# Patient Record
Sex: Female | Born: 1985 | Race: White | Hispanic: No | Marital: Single | Smoking: Never smoker
Health system: Southern US, Community
[De-identification: ages and names within clinical notes are randomized; demographics above are authoritative.]

## PROBLEM LIST (undated history)

## (undated) ENCOUNTER — Inpatient Hospital Stay (HOSPITAL_COMMUNITY): Payer: Self-pay

## (undated) DIAGNOSIS — O368390 Maternal care for abnormalities of the fetal heart rate or rhythm, unspecified trimester, not applicable or unspecified: Secondary | ICD-10-CM

## (undated) DIAGNOSIS — F909 Attention-deficit hyperactivity disorder, unspecified type: Secondary | ICD-10-CM

## (undated) DIAGNOSIS — F32A Depression, unspecified: Secondary | ICD-10-CM

## (undated) DIAGNOSIS — J45909 Unspecified asthma, uncomplicated: Secondary | ICD-10-CM

## (undated) DIAGNOSIS — F329 Major depressive disorder, single episode, unspecified: Secondary | ICD-10-CM

## (undated) HISTORY — DX: Unspecified asthma, uncomplicated: J45.909

## (undated) HISTORY — DX: Major depressive disorder, single episode, unspecified: F32.9

## (undated) HISTORY — PX: NO PAST SURGERIES: SHX2092

## (undated) HISTORY — DX: Attention-deficit hyperactivity disorder, unspecified type: F90.9

## (undated) HISTORY — DX: Depression, unspecified: F32.A

---

## 2007-06-11 ENCOUNTER — Ambulatory Visit: Payer: Self-pay | Admitting: Family Medicine

## 2007-07-05 ENCOUNTER — Ambulatory Visit: Payer: Self-pay

## 2010-01-07 ENCOUNTER — Ambulatory Visit: Payer: Self-pay | Admitting: Family Medicine

## 2011-08-25 ENCOUNTER — Ambulatory Visit (INDEPENDENT_AMBULATORY_CARE_PROVIDER_SITE_OTHER): Payer: PRIVATE HEALTH INSURANCE | Admitting: Family Medicine

## 2011-08-25 VITALS — BP 106/76 | HR 89 | Temp 98.7°F | Resp 16 | Ht 67.5 in | Wt 122.0 lb

## 2011-08-25 DIAGNOSIS — N912 Amenorrhea, unspecified: Secondary | ICD-10-CM

## 2011-08-25 NOTE — Progress Notes (Signed)
[facility] [address], [place] [address] [phone]  Date:  08/25/2011   Name:  Erin Ayala   DOB:  1985/10/26   MRN:  [id number]  PCP:  No primary provider on file.    Chief Complaint: Possible Pregnancy   History of Present Illness:  Erin Ayala is a 26 y.o. very pleasant female patient who presents with the following:  She took a home HCG test and it was positive yesterday.  Her menses can be irregular- tends to have long cycles.  She noted nausea - she vomited yesterday at roller derby practice-  and sore breasts.  Her LMP was 07/10/11.  She has not had any bleeding or pain.    She has never been pregnant before.  Her pregnancy is unexpected but still good news.  She is here with her husband today- they plan to have the baby and are surprised but pleased.    Luccia is otherwise generally healthy.  She does not have an Chief Financial Officer in town yet.  She uses her adderall sporadically- she plans to stop this now that she is pregnant.    There is no problem list on file for this patient.   No past medical history on file.  No past surgical history on file.  History  Substance Use Topics  . Smoking status: Never Smoker   . Smokeless tobacco: Not on file  . Alcohol Use: Not on file    No family history on file.  No Known Allergies  Medication list has been reviewed and updated.  Current Outpatient Prescriptions on File Prior to Visit  Medication Sig Dispense Refill  . amphetamine-dextroamphetamine (ADDERALL) 10 MG tablet Take 10 mg by mouth 4 (four) times daily.        Review of Systems:  As per HPI- otherwise negative.   Physical Examination: Filed Vitals:   08/25/11 1341  BP: 106/76  Pulse: 89  Temp: 98.7 F (37.1 C)  Resp: 16   Filed Vitals:   08/25/11 1341  Height: 5' 7.5" (1.715 m)  Weight: 122 lb (55.339 kg)   Body mass index is 18.83 kg/(m^2). Ideal Body Weight: Weight in (lb) to have BMI = 25: 161.7   GEN: WDWN,  NAD, Non-toxic, A & O x 3 HEENT: Atraumatic, Normocephalic. Neck supple. No masses, No LAD.  Oropharynx wnl.  Ears and Nose: No external deformity. CV: RRR, No M/G/R. No JVD. No thrill. No extra heart sounds. PULM: CTA B, no wheezes, crackles, rhonchi. No retractions. No resp. distress. No accessory muscle use. ABD: S, NT, ND, +BS. No rebound. No HSM. EXTR: No c/c/e NEURO Normal gait.  PSYCH: Normally interactive. Conversant. Not depressed or anxious appearing.  Calm demeanor.   Assessment and Plan: 1. Amenorrhea  hCG, serum, qualitative   Terisha could not give a urine sample today- she elected to have a blood draw for HCG testing.  I will contact her with the results- however she knows that a positive urine test is usually accurate.   Went over basic guidelines- she will stop roller derby, and stop her adderall. See pt instructions for more.  She will call the OB of her choice and set up an appt  Abbe Amsterdam, MD

## 2011-08-25 NOTE — Patient Instructions (Addendum)
Congratulations!   I will be in touch with your blood test result.   By your last period, you are about 6.[redacted] weeks pregnant today Your estimated due date is 04/15/12.    Avoid tobacco, alcohol, and fish high in mercury. Start your pre-natal vitamin today   Buy a book such as "What to expect" for lots of good information   [facility] [address] [phone]  [facility] [address] [place], [place] [address] [phone]   [facility] [address] [place], [place] [phone]

## 2011-10-08 ENCOUNTER — Other Ambulatory Visit: Payer: PRIVATE HEALTH INSURANCE

## 2011-10-08 ENCOUNTER — Ambulatory Visit (INDEPENDENT_AMBULATORY_CARE_PROVIDER_SITE_OTHER): Payer: Medicaid Other

## 2011-10-08 DIAGNOSIS — Z331 Pregnant state, incidental: Secondary | ICD-10-CM

## 2011-10-08 LAB — POCT URINALYSIS DIPSTICK
Bilirubin, UA: NEGATIVE
Glucose, UA: NEGATIVE
pH, UA: 5

## 2011-10-09 LAB — PRENATAL PANEL VII
Basophils Relative: 0 % (ref 0–1)
HCT: 34.8 % — ABNORMAL LOW (ref 36.0–46.0)
HIV: NONREACTIVE
Hemoglobin: 11.7 g/dL — ABNORMAL LOW (ref 12.0–15.0)
Hepatitis B Surface Ag: NEGATIVE
Lymphs Abs: 1.6 10*3/uL (ref 0.7–4.0)
MCH: 29 pg (ref 26.0–34.0)
MCHC: 33.6 g/dL (ref 30.0–36.0)
Monocytes Absolute: 0.6 10*3/uL (ref 0.1–1.0)
Monocytes Relative: 7 % (ref 3–12)
Neutro Abs: 5.7 10*3/uL (ref 1.7–7.7)
RBC: 4.04 MIL/uL (ref 3.87–5.11)
Rh Type: POSITIVE
Rubella: 31.6 IU/mL — ABNORMAL HIGH

## 2011-10-11 ENCOUNTER — Other Ambulatory Visit: Payer: Self-pay

## 2011-10-11 LAB — CULTURE, OB URINE: Colony Count: >=100000

## 2011-10-11 MED ORDER — NITROFURANTOIN MONOHYD MACRO 100 MG PO CAPS: 100.0000 mg | ORAL_CAPSULE | Freq: Two times a day (BID) | ORAL | Status: AC

## 2011-10-12 ENCOUNTER — Ambulatory Visit (INDEPENDENT_AMBULATORY_CARE_PROVIDER_SITE_OTHER): Payer: Medicaid Other

## 2011-10-12 ENCOUNTER — Encounter: Payer: Self-pay | Admitting: Obstetrics and Gynecology

## 2011-10-12 ENCOUNTER — Ambulatory Visit (INDEPENDENT_AMBULATORY_CARE_PROVIDER_SITE_OTHER): Payer: Medicaid Other | Admitting: Obstetrics and Gynecology

## 2011-10-12 VITALS — BP 98/60 | Wt 130.0 lb

## 2011-10-12 DIAGNOSIS — J45909 Unspecified asthma, uncomplicated: Secondary | ICD-10-CM

## 2011-10-12 DIAGNOSIS — F329 Major depressive disorder, single episode, unspecified: Secondary | ICD-10-CM | POA: Insufficient documentation

## 2011-10-12 DIAGNOSIS — Z331 Pregnant state, incidental: Secondary | ICD-10-CM

## 2011-10-12 DIAGNOSIS — F32A Depression, unspecified: Secondary | ICD-10-CM | POA: Insufficient documentation

## 2011-10-12 DIAGNOSIS — N926 Irregular menstruation, unspecified: Secondary | ICD-10-CM | POA: Insufficient documentation

## 2011-10-12 DIAGNOSIS — F988 Other specified behavioral and emotional disorders with onset usually occurring in childhood and adolescence: Secondary | ICD-10-CM

## 2011-10-12 LAB — US OB COMP LESS 14 WKS

## 2011-10-12 NOTE — Progress Notes (Signed)
Subjective:    Erin Ayala is being seen today for her first obstetrical visit at [redacted]w[redacted]d gestation by uncertain LMP--cycles are at least 40-45 days long, so patient feels she is not as far as 10 weeks.    She reports no issues or concerns.  Her obstetrical history is significant for: Patient Active Problem List  Diagnosis  . Asthma  . ADD (attention deficit disorder)  . Depression  . Irregular periods/menstrual cycles    Relationship with FOB:  Partner, lives with patient  Patient does intend to breast feed.   Pregnancy history fully reviewed.  The following portions of the patient's history were reviewed and updated as appropriate: allergies, current medications, past family history, past medical history, past social history, past surgical history and problem list.  Review of Systems Pertinent ROS is described in HPI   Objective:   BP 98/60  Wt 130 lb (58.968 kg)  LMP 07/10/2011 Wt Readings from Last 1 Encounters:  10/12/11 130 lb (58.968 kg)   BMI: There is no height on file to calculate BMI.  General: alert, cooperative and no distress Respiratory: clear to auscultation bilaterally Cardiovascular: regular rate and rhythm, S1, S2 normal, no murmur Breasts:  No dominant masses, nipples erect Gastrointestinal: soft, non-tender; no masses,  no organomegaly Extremities: extremities normal, no pain or edema Vaginal Bleeding: None  EXTERNAL GENITALIA: normal appearing vulva with no masses, tenderness or lesions VAGINA: no abnormal discharge or lesions CERVIX: no lesions or cervical motion tenderness; cervix closed, long, firm UTERUS: gravid and consistent with 13-14 weeks ADNEXA: no masses palpable and nontender OB EXAM PELVIMETRY: appears adequate  [place] today:  13 weeks, EDC 04/18/12--with establish that as EDC due to long cycles.  FHR:  160  bpm  Assessment:    Pregnancy at  13 weeks--dating by [place] c/w LMP Plans Quad screen Plan:     Prenatal panel reviewed  and discussed with the patient:yes Pap smear collected:yes GC/Chlamydia collected:yes Wet prep:  Neg Discussion of Genetic testing options: Desires Quad screen Prenatal vitamins recommended Problem list reviewed and updated.  Plan of care: Follow up in 4-5 weeks for anatomy US and ROB Quad screen at that visit.  Nigel Bridgeman CNM, [place] 10/12/2011 11:20 PM

## 2011-10-12 NOTE — Progress Notes (Signed)
[redacted]w[redacted]d Pt unable to void

## 2011-10-14 ENCOUNTER — Encounter: Payer: Self-pay | Admitting: Obstetrics and Gynecology

## 2011-10-14 LAB — PAP IG, CT-NG, RFX HPV ASCU
Chlamydia Probe Amp: NEGATIVE
GC Probe Amp: NEGATIVE

## 2011-11-11 ENCOUNTER — Encounter: Payer: Self-pay | Admitting: Obstetrics and Gynecology

## 2011-11-11 ENCOUNTER — Ambulatory Visit (INDEPENDENT_AMBULATORY_CARE_PROVIDER_SITE_OTHER): Payer: Medicaid Other | Admitting: Obstetrics and Gynecology

## 2011-11-11 ENCOUNTER — Other Ambulatory Visit: Payer: Self-pay

## 2011-11-11 ENCOUNTER — Ambulatory Visit (INDEPENDENT_AMBULATORY_CARE_PROVIDER_SITE_OTHER): Payer: Medicaid Other

## 2011-11-11 VITALS — BP 90/58 | Wt 136.0 lb

## 2011-11-11 DIAGNOSIS — Z3689 Encounter for other specified antenatal screening: Secondary | ICD-10-CM

## 2011-11-11 DIAGNOSIS — Z331 Pregnant state, incidental: Secondary | ICD-10-CM

## 2011-11-11 LAB — US OB COMP + 14 WK

## 2011-11-11 NOTE — Progress Notes (Signed)
Patient ID: Erin Ayala, female   DOB: 08-Sep-1985, 26 y.o.   MRN: [id number] 110w2d QUAD today pt request. [place] discussed f/o limited anatomy. Lavera Guise, CNM

## 2011-11-11 NOTE — Progress Notes (Signed)
[redacted]w[redacted]d Posterior placenta Marginal placental cord insertion Cord is less than 1 cm from uterine wall WNL fluid AP pocket 5.2 cm F/u 2 wks for anatomy completion. Follow marginal cord growth study at 26 wks cx closed

## 2011-11-12 LAB — AFP, QUAD SCREEN
Age Alone: 1:1060 {titer}
Down Syndrome Scr Risk Est: 1:5610 {titer}
HCG, Total: 15113 m[IU]/mL
INH: 167.3 pg/mL
MoM for hCG: 0.85
Open Spina bifida: NEGATIVE
Osb Risk: <1:27300 {titer}
Tri 18 Scr Risk Est: NEGATIVE
Trisomy 18 (Edward) Syndrome Interp.: 1:35400 {titer}

## 2011-11-29 ENCOUNTER — Ambulatory Visit (INDEPENDENT_AMBULATORY_CARE_PROVIDER_SITE_OTHER): Payer: Medicaid Other

## 2011-11-29 ENCOUNTER — Ambulatory Visit (INDEPENDENT_AMBULATORY_CARE_PROVIDER_SITE_OTHER): Payer: Medicaid Other | Admitting: Obstetrics and Gynecology

## 2011-11-29 VITALS — BP 100/56 | Wt 144.0 lb

## 2011-11-29 DIAGNOSIS — Z331 Pregnant state, incidental: Secondary | ICD-10-CM

## 2011-11-29 DIAGNOSIS — Z3689 Encounter for other specified antenatal screening: Secondary | ICD-10-CM

## 2011-11-29 LAB — US OB FOLLOW UP

## 2011-11-29 NOTE — Progress Notes (Signed)
Pt stated no issues today.  

## 2011-11-29 NOTE — Progress Notes (Signed)
Patient ID: Erin Ayala, female   DOB: Nov 29, 1985, 26 y.o.   MRN: [id number] [redacted]w[redacted]d [place] today reviewed plan f/o 28 weeks growth with marginal insertion. Lavera Guise, CNM

## 2011-11-29 NOTE — Progress Notes (Signed)
F/o [place] with placenta posterior marginal cord insertion and circumvallate  F/o [place] growth 28 weeks

## 2011-12-27 ENCOUNTER — Encounter: Payer: PRIVATE HEALTH INSURANCE | Admitting: Obstetrics and Gynecology

## 2012-01-03 ENCOUNTER — Encounter: Payer: PRIVATE HEALTH INSURANCE | Admitting: Obstetrics and Gynecology

## 2012-01-18 ENCOUNTER — Encounter: Payer: PRIVATE HEALTH INSURANCE | Admitting: Obstetrics and Gynecology

## 2012-01-25 ENCOUNTER — Encounter: Payer: Self-pay | Admitting: Obstetrics and Gynecology

## 2012-01-26 NOTE — L&D Delivery Note (Signed)
Erin Ayala, Brines DOB: 1985-04-30  Vacuum Extraction Vaginal Delivery Note  DATE: April 22, 2012  PreOp Diagnosis: [redacted]w[redacted]d Weeks Gestation    Second stage fetal heart rate decelerations  PostOp Diagnosis: [redacted]w[redacted]d Weeks Gestation    Second stage fetal heart rate decelerations                                     Body cord    Second Degree Midline Laceration  Procedure:  Vacuum Extraction Vaginal Delivery    Repair of Second Degree Laceration  Obstetrician:  Leonard Schwartz, M.D.  Anesthesia:  Epidural  Disposition:  The patient has been followed at the [address] division of [facility].  The second stage of labor was complicated by  variable decelerations to 60 beats per minute with each contraction. A cesarean section was recommended but declined. The patient elected to to continue to push. She accepted the potential risks of fetal harm. Once the fetal head was at a +3 station, we discussed our management options which include observation only, continued pushing, operative vaginal delivery, and Cesarean delivery.  The risk and benefits of each of these options were reviewed with the patient and her family.  Questions were answered. The patient elected to proceed with operative vaginal delivery. We reviewed forceps vaginal delivery and vacuum extraction vaginal delivery.  The risks of each were outlined. The patient elected vacuum extraction vaginal delivery.  The specific risks were discussed including caput formation, hematoma formation, the uncommon risk of intracranial bleeding and fetal morbidity, and the risk that the procedure may not be successful and an emergency  Cesarean section may be required.  The risks of Cesarean Section were outlined including anesthetic complications, bleeding, infection, and possible damage to the surrounding organs. The patient accepted the above risks and requested that we proceed.  Findings:  A  female ( [name]) was delivered  from a occiput anterior position.  The Apgar scores were 8 at one minute and 9 at five minutes. The placenta had a three vessel cord.  The placental surfaces appeared normal.  The placenta was sent to pathology.  A second degree laceration was noted.  No upper vaginal or cervical lacerations were seen.  Procedure:  The patient was evaluated on the Labor and Delivery suite.  The procedure was reviewed and a permit was signed.  The patient was placed in a lithotomy position.  The perineum was prepped and draped.  The bladder was emptied previously by a Foley catheter.  The cervix was completely dilated and 100% effaced.  The presenting part was at a plus 3 station.  The estimated fetal weight was medium.  The Kiwi vacuum extractor was placed.  The patient was allowed to push.  The head was delivered after 3 pushes.  No "pop-off" occurred. The mouth and nose were suctioned.  The remainder of the baby was delivered.  The cord was clamped and cut.  The baby was placed in the baby warmer for evaluation by the pediatric team.  Routine cord blood studies were obtained.  The placenta was delivered.  The perineum was evaluated.  A second degree midline laceration was repaired in a standard fashion using 00 Vicryl.  A first degree left labial laceration was repaired using 3-0 Monocryl. The estimated blood loss was 350 cc's.  The patient tolerated the procedure well.  She was returned to the supine position. She was  allowed to remain on L and D.  The baby stayed with the mother for bonding.

## 2012-01-31 ENCOUNTER — Encounter: Payer: PRIVATE HEALTH INSURANCE | Admitting: Obstetrics and Gynecology

## 2012-02-11 ENCOUNTER — Ambulatory Visit: Payer: PRIVATE HEALTH INSURANCE | Admitting: Obstetrics and Gynecology

## 2012-02-11 VITALS — BP 100/62 | Wt 168.0 lb

## 2012-02-11 DIAGNOSIS — Z331 Pregnant state, incidental: Secondary | ICD-10-CM

## 2012-02-11 DIAGNOSIS — O0930 Supervision of pregnancy with insufficient antenatal care, unspecified trimester: Secondary | ICD-10-CM

## 2012-02-11 DIAGNOSIS — IMO0002 Reserved for concepts with insufficient information to code with codable children: Secondary | ICD-10-CM

## 2012-02-11 NOTE — Progress Notes (Signed)
[redacted]w[redacted]d Pt not seen since 11/2011 due to insurance issues Has no complaints Ultrasound at [facility] for marginal cord insertion. Pt will come for lab only for Glucola

## 2012-02-11 NOTE — Progress Notes (Signed)
[redacted]w[redacted]d Pt has not been seen since 11/2011 due to insurance change. Pt has not had 1hr gtt done as yet. Pt could not void thus far, given water.

## 2012-02-14 ENCOUNTER — Other Ambulatory Visit: Payer: PRIVATE HEALTH INSURANCE

## 2012-02-14 DIAGNOSIS — Z331 Pregnant state, incidental: Secondary | ICD-10-CM

## 2012-02-14 NOTE — Progress Notes (Unsigned)
Urine :NEG/NEG  Draw @ 11:56am

## 2012-02-15 LAB — RPR

## 2012-02-24 ENCOUNTER — Inpatient Hospital Stay (HOSPITAL_COMMUNITY): Payer: Medicaid Other

## 2012-02-24 ENCOUNTER — Inpatient Hospital Stay (HOSPITAL_COMMUNITY)
Admission: EM | Admit: 2012-02-24 | Discharge: 2012-02-24 | Disposition: A | Discharge status: Home / Self Care | Payer: Medicaid Other | Attending: Obstetrics and Gynecology | Admitting: Obstetrics and Gynecology

## 2012-02-24 ENCOUNTER — Encounter (HOSPITAL_COMMUNITY): Payer: Self-pay | Admitting: Emergency Medicine

## 2012-02-24 DIAGNOSIS — R109 Unspecified abdominal pain: Secondary | ICD-10-CM

## 2012-02-24 DIAGNOSIS — O4700 False labor before 37 completed weeks of gestation, unspecified trimester: Secondary | ICD-10-CM

## 2012-02-24 DIAGNOSIS — O479 False labor, unspecified: Secondary | ICD-10-CM

## 2012-02-24 DIAGNOSIS — R10A0 Flank pain, unspecified side: Secondary | ICD-10-CM

## 2012-02-24 LAB — WET PREP, GENITAL
Clue Cells Wet Prep HPF POC: NONE SEEN
Trich, Wet Prep: NONE SEEN
Yeast Wet Prep HPF POC: NONE SEEN

## 2012-02-24 LAB — CBC
MCH: 29.7 pg (ref 26.0–34.0)
MCHC: 33.3 g/dL (ref 30.0–36.0)
Platelets: 160 10*3/uL (ref 150–400)
RBC: 3.43 MIL/uL — ABNORMAL LOW (ref 3.87–5.11)
RDW: 12.3 % (ref 11.5–15.5)

## 2012-02-24 LAB — URINALYSIS, ROUTINE W REFLEX MICROSCOPIC
Bilirubin Urine: NEGATIVE
Nitrite: NEGATIVE
Specific Gravity, Urine: 1.004 — ABNORMAL LOW (ref 1.005–1.030)
Urobilinogen, UA: 0.2 mg/dL (ref 0.0–1.0)
pH: 7 (ref 5.0–8.0)

## 2012-02-24 LAB — GC/CHLAMYDIA PROBE AMP: CT Probe RNA: NEGATIVE

## 2012-02-24 LAB — URINE MICROSCOPIC-ADD ON

## 2012-02-24 LAB — OB RESULTS CONSOLE GBS: GBS: POSITIVE

## 2012-02-24 LAB — OB RESULTS CONSOLE GC/CHLAMYDIA
Chlamydia: NEGATIVE
Gonorrhea: NEGATIVE

## 2012-02-24 MED ORDER — FENTANYL CITRATE 0.05 MG/ML IJ SOLN
50.0000 ug | Freq: Once | INTRAMUSCULAR | Status: AC
  Administered 2012-02-24: 50 ug via INTRAVENOUS
  Filled 2012-02-24: qty 2

## 2012-02-24 MED ORDER — PANTOPRAZOLE SODIUM 40 MG PO TBEC: 40.0000 mg | DELAYED_RELEASE_TABLET | Freq: Every day | ORAL | Status: DC

## 2012-02-24 MED ORDER — NIFEDIPINE 10 MG PO CAPS
10.0000 mg | ORAL_CAPSULE | ORAL | Status: DC | PRN
  Administered 2012-02-24 (×2): 10 mg via ORAL
  Filled 2012-02-24 (×2): qty 1

## 2012-02-24 MED ORDER — ACETAMINOPHEN 10 MG/ML IV SOLN
1000.0000 mg | Freq: Once | INTRAVENOUS | Status: AC
  Administered 2012-02-24: 1000 mg via INTRAVENOUS
  Filled 2012-02-24: qty 100

## 2012-02-24 MED ORDER — LACTATED RINGERS IV SOLN
INTRAVENOUS | Status: DC
  Administered 2012-02-24: 10:00:00 via INTRAVENOUS

## 2012-02-24 MED ORDER — HYDROCODONE-ACETAMINOPHEN 5-500 MG PO TABS: 1.0000 | ORAL_TABLET | ORAL | Status: DC | PRN

## 2012-02-24 MED ORDER — LACTATED RINGERS IV SOLN
INTRAVENOUS | Status: DC
  Administered 2012-02-24 (×3): via INTRAVENOUS

## 2012-02-24 MED ORDER — SODIUM CHLORIDE 0.9 % IV SOLN
Freq: Once | INTRAVENOUS | Status: AC
  Administered 2012-02-24: 05:00:00 via INTRAVENOUS

## 2012-02-24 NOTE — MAU Note (Signed)
Gevena Barre CNM notified of pt arrival from [facility].

## 2012-02-24 NOTE — Progress Notes (Signed)
Dr. Estanislado Pandy informed of U/S results, pt's pain is a 3-4.  See orders, MD is coming to see pt.

## 2012-02-24 NOTE — ED Notes (Signed)
Report given to [facility]. 

## 2012-02-24 NOTE — ED Notes (Signed)
Pt states she awoke @ 0230 with severe L flank pain, non radiating,  +nausea. [redacted] Weeks pregnant G1P0

## 2012-02-24 NOTE — ED Notes (Signed)
[facility] called for transport. 

## 2012-02-24 NOTE — Progress Notes (Signed)
MD informed of pt's request for pain med, fentanyl & renal U/S ordered.

## 2012-02-24 NOTE — Progress Notes (Signed)
Call from [facility] RN, pt presents with L sided flank pain. Started at 0230, awoke from sleep. Has regular PNC at [facility]. Pt denies abd pain or cramping. + fetal movement. Monitors on, assessing.

## 2012-02-24 NOTE — MAU Provider Note (Signed)
History     CSN: [id number]  Arrival date and time: 02/24/12 6578   First Provider Initiated Contact with Patient 02/24/12 0701      Chief Complaint  Patient presents with  . Flank Pain   HPI Comments: Pt is a 27yo G1P0 at [redacted]w[redacted]d that arrived to [facility] unannounced w sudden onset of L flank pain at about 2am. Points to L flank where the pain is, states it's constant and was 10/10 when she went to ER. She rcv'd fentanyl IVP that helped significantly with the pain, but states it is beginning to return. She states she's had ctx off and on for weeks, but wasn't feeling regular or painful ctx, denies any VB, LOF, GFM. Last had IC last evening. Pregnancy has essentially been uncomplicated, she did not have any visits from Nov - Jan17th. There is a marginal cord insertion.   Flank Pain Pertinent negatives include no abdominal pain, dysuria or fever.      Past Medical History  Diagnosis Date  . Asthma high school    Exercised induced  . ADHD (attention deficit hyperactivity disorder)   . Depression     Past Surgical History  Procedure Date  . No past surgeries     Family History  Problem Relation Age of Onset  . Seizures Mother   . Depression Father   . Depression Brother   . Anxiety disorder Sister     History  Substance Use Topics  . Smoking status: Never Smoker   . Smokeless tobacco: Never Used  . Alcohol Use: No    Allergies: No Known Allergies  Prescriptions prior to admission  Medication Sig Dispense Refill  . acetaminophen (TYLENOL) 500 MG tablet Take 500 mg by mouth every 6 (six) hours as needed. For pain      . Prenatal Vit-Fe Fumarate-FA (PRENATAL MULTIVITAMIN) TABS Take 1 tablet by mouth every morning.        Review of Systems  Constitutional: Negative for fever.  Gastrointestinal: Negative for abdominal pain.  Genitourinary: Positive for flank pain. Negative for dysuria and urgency.  All other systems reviewed and are negative.   Physical Exam    Blood pressure 113/68, pulse 70, temperature 98.9 F (37.2 C), temperature source Oral, resp. rate 18, height 5\' 6"  (1.676 m), weight 170 lb (77.111 kg), last menstrual period 07/10/2011, SpO2 99.00%.  Physical Exam  Nursing note and vitals reviewed. Constitutional: She is oriented to person, place, and time. She appears well-developed and well-nourished. No distress.  HENT:  Head: Normocephalic.  Eyes: Pupils are equal, round, and reactive to light.  Neck: Normal range of motion.  Cardiovascular: Normal rate, regular rhythm and normal heart sounds.   Respiratory: Effort normal and breath sounds normal.  GI: Soft. Bowel sounds are normal. There is no tenderness.       AGA   Genitourinary: Vagina normal.       White creamy discharge, no odor,  cx - cl/th/high no pp in pelvis   Musculoskeletal: Normal range of motion.       Neg CVAT   Neurological: She is alert and oriented to person, place, and time. She has normal reflexes.  Skin: Skin is warm and dry.  Psychiatric: She has a normal mood and affect. Her behavior is normal.   FHR - cat 1 toco - irreg 4-6 min  MAU Course  Procedures    Assessment and Plan  IUP at [redacted]w[redacted]d Preterm ctx  Flank pain of unknown origin  UA clear -  micro neg VSS   UA sent for cx CBC IVF's 500cc bolus rcv'd in ER Procardia 10mg  q10 min prn x3 doses Wet prep Gc/ct sent GBS sent  D/W Dr Pura Spice 02/24/2012, 7:23 AM

## 2012-02-24 NOTE — MAU Note (Signed)
S/O:Patient reports complete resolution of pain after receiving Tylenol 1000 mg IV All findings are compatible with left kidney stone: U/A with trace blood, renal ultrasound with left hydronephrosis and clinical description of pain. No previous h/o kidney stone.  PE: uterus NT        Mild left CVAT  A/P: D/C home with instructions to strain urine         Tylenol 1000 mg every 6 hours PRN         Vicodin PRN         Keep appointment on 02/28/12 with growth ultrasound         Pt to call if worsening of pain or fever

## 2012-02-24 NOTE — MAU Note (Signed)
Pt arrived via [facility] from [facility] for Left flank pain.

## 2012-02-24 NOTE — Progress Notes (Addendum)
Call to [facility], order to transfer to MAU  Report yo Rudi Coco, RN MAU and [facility]

## 2012-02-24 NOTE — ED Provider Notes (Signed)
History     CSN: [id number]  Arrival date & time 02/24/12  0404   First MD Initiated Contact with Patient 02/24/12 [phone]      Chief Complaint  Patient presents with  . Flank Pain    (Consider location/radiation/quality/duration/timing/severity/associated sxs/prior treatment) HPI Comments: Patient states for the past 2 days she has had increased urination without dysuria, about 2 hours ago she was awakened form sleep with sever L flank pain that has been steady.  She tried taking tylenol and walking around without relief.  She recently restated OB care. Denies vaginal bleeding/discharge. Reports no recent illnesses   Patient is a 27 y.o. female presenting with flank pain.  Flank Pain This is a new problem. The current episode started today. The problem has been gradually worsening. Associated symptoms include nausea and urinary symptoms. Pertinent negatives include no abdominal pain, chills, congestion, fever, rash, sore throat or vomiting. Nothing aggravates the symptoms. She has tried acetaminophen for the symptoms.    Past Medical History  Diagnosis Date  . Asthma high school    Exercised induced  . ADHD (attention deficit hyperactivity disorder)   . Depression     History reviewed. No pertinent past surgical history.  Family History  Problem Relation Age of Onset  . Seizures Mother   . Depression Father   . Depression Brother   . Anxiety disorder Sister     History  Substance Use Topics  . Smoking status: Never Smoker   . Smokeless tobacco: Never Used  . Alcohol Use: No    OB History    Grav Para Term Preterm Abortions TAB SAB Ect Mult Living   1               Review of Systems  Constitutional: Negative for fever and chills.  HENT: Negative for congestion and sore throat.   Gastrointestinal: Positive for nausea. Negative for vomiting, abdominal pain, diarrhea and constipation.  Genitourinary: Positive for flank pain. Negative for dysuria, vaginal bleeding,  vaginal discharge and vaginal pain.  Musculoskeletal: Positive for back pain.  Skin: Negative for rash and wound.    Allergies  Review of patient's allergies indicates no known allergies.  Home Medications   Current Outpatient Rx  Name  Route  Sig  Dispense  Refill  . ACETAMINOPHEN 500 MG PO TABS   Oral   Take 500 mg by mouth every 6 (six) hours as needed. For pain         . PRENATAL MULTIVITAMIN CH   Oral   Take 1 tablet by mouth every morning.           BP 121/68  Pulse 87  Temp 97.9 F (36.6 C) (Oral)  Resp 16  Ht 5\' 6"  (1.676 m)  Wt 170 lb (77.111 kg)  BMI 27.44 kg/m2  SpO2 100%  LMP 07/10/2011  Physical Exam  Constitutional: She is oriented to person, place, and time. She appears well-developed and well-nourished.  HENT:  Head: Normocephalic.  Neck: Normal range of motion.  Cardiovascular: Normal rate and regular rhythm.   Pulmonary/Chest: Effort normal and breath sounds normal.  Abdominal: Soft. There is no tenderness.       Uterus gravid at 4 CM  about umbilicus, While examining patient she had a uterine contraction    Musculoskeletal: Normal range of motion. She exhibits no edema.  Neurological: She is alert and oriented to person, place, and time.  Skin: Skin is warm and dry. No rash noted.    ED  Course  Procedures (including critical care time)  Labs Reviewed  URINALYSIS, ROUTINE W REFLEX MICROSCOPIC - Abnormal; Notable for the following:    Specific Gravity, Urine 1.004 (*)     Hgb urine dipstick SMALL (*)     Leukocytes, UA SMALL (*)     All other components within normal limits  URINE MICROSCOPIC-ADD ON   No results found.   1. Preterm contractions   2. Abdominal pain   3. Flank pain       MDM  Will have Rapid OB response team examine patient, fetal monitoring  Give IV fluids, obtain UA  Patient to be transferred to [facility] for continued monitoring       Arman Filter, NP 02/24/12 [phone]

## 2012-02-24 NOTE — ED Notes (Signed)
Pt states she was woken from sleep by pain in her left flank. Pain is localized and does not radiate. Pt tried taking tylenol, walking around and drinking water, none of which helped. Pt is also [redacted] weeks pregnant. Denies discharge. Also denies felling movement from the baby over the last hour but was unsure if this was just because she wasn't paying attention since she was focused on the pain.

## 2012-02-24 NOTE — ED Notes (Signed)
Called [facility]. They are coming to see the pt.

## 2012-02-24 NOTE — ED Provider Notes (Signed)
Medical screening examination/treatment/procedure(s) were performed by non-physician practitioner and as supervising physician I was immediately available for consultation/collaboration.  Sunnie Nielsen, MD 02/24/12 [phone]

## 2012-02-25 LAB — URINE CULTURE: Colony Count: 6000

## 2012-02-28 ENCOUNTER — Encounter: Payer: Self-pay | Admitting: Obstetrics and Gynecology

## 2012-02-28 ENCOUNTER — Ambulatory Visit: Payer: PRIVATE HEALTH INSURANCE

## 2012-02-28 ENCOUNTER — Ambulatory Visit: Payer: PRIVATE HEALTH INSURANCE | Admitting: Obstetrics and Gynecology

## 2012-02-28 VITALS — BP 90/60 | Wt 171.0 lb

## 2012-02-28 DIAGNOSIS — IMO0002 Reserved for concepts with insufficient information to code with codable children: Secondary | ICD-10-CM

## 2012-02-28 DIAGNOSIS — Z331 Pregnant state, incidental: Secondary | ICD-10-CM

## 2012-02-28 NOTE — Patient Instructions (Signed)
Contraception Choices  Birth control (contraception) can stop pregnancy from happening. Different types of birth control work in different ways. Some can:  · Make the mucus in the cervix thick. This makes it hard for sperm to get into the uterus.  · Thin the lining of the uterus. This makes it hard for an egg to attach to the wall of the uterus.  · Stop the ovaries from releasing an egg.  · Block the sperm from reaching the egg.  Certain types of surgery can stop pregnancy from happening. For women, the sugery closes the fallopian tubes (tubal ligation). For men, the surgery stops sperm from releasing during sex (vasectomy).  HORMONAL BIRTH CONTROL  Hormonal birth control stops pregnancy by putting hormones into your body. Types of birth control include:  · A small tube put under the skin of the upper arm (implant). The tube can stay in place for 3 years.  · Shots given every 3 months.  · Pills taken every day or once after sex (intercourse).  · Patches that are changed once a week.  · A ring put into the vagina (vaginal ring). The ring is left in place for 3 weeks and removed for 1 week. Then, a new ring is put in the vagina.  BARRIER BIRTH CONTROL   Barrier birth control blocks sperm from reaching the egg. Types of birth control include:   · A thin covering worn on the penis (female condom) during sex.  · A soft, loose covering put into the vagina (female condom) before sex.  · A rubber bowl that sits over the cervix (diaphragm). The bowl must be made for you. The bowl is put into the vagina before sex. The bowl is left in place for 6 to 8 hours after sex.  · A small, soft cup that fits over the cervix (cervical cap). The cup must be made for you. The cup can be left in place for 48 hours after sex.  · A sponge that is put into the vagina before sex.  · A chemical that kills or blocks sperm from getting into the cervix and uterus (spermicide). The chemical may be a cream, jelly, foam, or pill.  INTRAUTERINE (IUD)  BIRTH CONTROL   IUD birth control is a small, T-shaped piece of plastic. The plastic is put inside the uterus. There are 2 types of IUD:  · Copper IUD. The IUD is covered in copper wire. The copper makes a fluid that kills sperm. It can stay in place for 10 years.  · Hormone IUD. The hormone stops pregnancy from happening. It can stay in place for 5 years.  NATURAL FAMILY PLANNING BIRTH CONTROL   Natural family planning means not having sex or using barrier birth control when the woman is fertile. A woman can:  · Use a calendar to keep track of when she is fertile.  · Use a thermometer to measure her body temperature.  Protect yourself against sexual diseases no matter what type of birth control you use. Talk to your doctor about which type of birth control is best for you.  Document Released: 11/08/2008 Document Revised: 04/05/2011 Document Reviewed: 05/20/2010  Exit[employer]® Patient Information ©2013 [employer].

## 2012-02-28 NOTE — Progress Notes (Signed)
[redacted]w[redacted]d  Ultrasound shows:  EFW 5lb 6oz 69%ILE 69        [place] EDD: 04/15/12             AFI: 17.3 cm            Cervical length: 3.43 cm            Placenta localization: posterior            Fetal presentation: vertex    Comments: Stable marginal cord insertion.      Normal linear growth      No late presenting fetal abnormality seen.  Repeat U/S 4 wks Declines flu shot Hx asthma without any problems since teen years

## 2012-03-02 LAB — US OB FOLLOW UP

## 2012-03-14 ENCOUNTER — Ambulatory Visit: Payer: Medicaid Other | Admitting: Obstetrics and Gynecology

## 2012-03-14 VITALS — BP 90/60 | Wt 175.0 lb

## 2012-03-14 DIAGNOSIS — Z331 Pregnant state, incidental: Secondary | ICD-10-CM

## 2012-03-14 NOTE — Addendum Note (Signed)
Addended by: Lanna Poche on: 03/14/2012 04:23 PM   Modules accepted: Orders

## 2012-03-14 NOTE — Progress Notes (Signed)
[redacted]w[redacted]d No complaints today.

## 2012-03-14 NOTE — Progress Notes (Signed)
[redacted]w[redacted]d Doing well GFM rv'd FKC and labor

## 2012-03-23 ENCOUNTER — Ambulatory Visit: Payer: Medicaid Other | Admitting: Obstetrics and Gynecology

## 2012-03-23 ENCOUNTER — Encounter: Payer: Self-pay | Admitting: Obstetrics and Gynecology

## 2012-03-23 ENCOUNTER — Ambulatory Visit: Payer: Medicaid Other

## 2012-03-23 VITALS — BP 96/66 | Wt 177.0 lb

## 2012-03-23 DIAGNOSIS — Z331 Pregnant state, incidental: Secondary | ICD-10-CM

## 2012-03-23 DIAGNOSIS — IMO0002 Reserved for concepts with insufficient information to code with codable children: Secondary | ICD-10-CM

## 2012-03-23 DIAGNOSIS — O99820 Streptococcus B carrier state complicating pregnancy: Secondary | ICD-10-CM

## 2012-03-23 NOTE — Patient Instructions (Signed)
Group B Streptococcus Infection During Pregnancy  Group B streptococcus (GBS) is a type of bacteria often found in healthy women. GBS usually does not cause any symptoms or harm to healthy adult women, but the bacteria can make a baby very sick if it is passed to the baby during childbirth. GBS is not a sexually transmitted disease (STD). GBS is different from Group A streptococcus, the bacteria that causes "strep throat."  CAUSES   GBS bacteria can be found in the intestinal, reproductive, and urinary tracts of women. It can also be found in the female genital tract, most often in the vagina and rectal areas.   SYMPTOMS   In pregnancy, GBS can be in the following places:   Genital tract without symptoms.   Rectum without symptoms.   Urine with or without symptoms (asymptomatic bacteriuria).   Urinary symptoms can include pain, frequency, urgency, and blood with urination (cystitis).  Pregnant women who are infected with GBS are at increased risk of:   Early (premature) labor and delivery.   Prolonged rupture of the membranes.   Infection in the following places:   Bladder.   Kidneys (pyelonephritis).   Bag of waters or placenta (chorioamnionitis).   Uterus (endometritis) after delivery.  Newborns who are infected with GBS can develop:   Lung infection (pneumonia).   Blood infection (septicemia).   Infection of the lining of the brain and spinal cord (meningitis).  DIAGNOSIS   Diagnosis of GBS infection is made by screening tests done when you are 35 to [redacted] weeks pregnant. The test (culture) is an easy swab of the vagina and rectum. A sample of your urine might also be checked for the bacteria. Talk with your caregiver about a plan for labor if your test shows that you carry the GBS bacteria.  TREATMENT    If results of the culture are positive, showing that GBS is present, you likely will receive treatment with antibiotic medicines during labor. This will help prevent GBS from being passed to your baby. The antibiotics work only if they are given during labor. If treatment is given earlier in pregnancy, the bacteria may regrow and be present during labor. Tell your caregiver if you are allergic to penicillin or other antibiotics. Antibiotics are also given if:    Infection of the membranes (amnionitis) is suspected.   Labor begins or there is rupture of the membranes before 37 weeks of pregnancy and there is a high risk of delivering the baby.   The mother has a past history of giving birth to an infant with GBS infection.   The culture status is unknown (culture not performed or result not available) and there is:   Fever during labor.   Preterm labor (less than 37 weeks of pregnancy).   Prolonged rupture of membranes (18 hours or more).  Treatment of the mother during labor is not recommended when:   A planned cesarean delivery is done and there is no labor or ruptured membranes. This is true even if the mother tested positive for GBS.   There is a negative culture for GBS screening during the pregnancy, regardless of the risk factors during labor.  The infant will receive antibiotics if the infant tested positive for GBS or has signs and symptoms that suggest GBS infection is present. It is not recommended to routinely give antibiotics to infants whose mothers received antibiotic treatment during labor.  HOME CARE INSTRUCTIONS    Take all antibiotics as prescribed by your caregiver.     Only take medicine as directed by your caregiver.   Continue with prenatal visits and care.   Return for follow-up appointments and cultures.   Follow your caregiver's instructions.  SEEK MEDICAL CARE IF:    You have pain with urination.   You have frequent urination.   You have blood in your urine.   SEEK IMMEDIATE MEDICAL CARE IF:   You have a fever.   You have pain in the back, side, or uterus.   You have chills.   You have abdominal swelling (distension) or pain.   You have labor pains (contractions) every 10 minutes or more often.   You are leaking fluid or bleeding from your vagina.   You have pelvic pressure that feels like your baby is pushing down.   You have a low, dull backache.   You have cramps that feel like your period.   You have abdominal cramps with or without diarrhea.   You have repeated vomiting and diarrhea.   You have trouble breathing.   You develop confusion.   You have stiffness of your body or neck.  MAKE SURE YOU:    Understand these instructions.   Will watch your condition.   Will get help right away if you are not doing well or get worse.  Document Released: 04/20/2007 Document Revised: 04/05/2011 Document Reviewed: 05/24/2010  [employer] Patient Information 2013 [employer]

## 2012-03-23 NOTE — Progress Notes (Signed)
[redacted]w[redacted]d EFW 3282 grams 84 % AFi 17.6 SIUP vtx  GBS positive on a culture 1/10 /2014 tx in labor

## 2012-03-23 NOTE — Progress Notes (Signed)
[redacted]w[redacted]d Ultrasound shows:  SIUP  S=D     [place] EDD: 04/18/2012           AFI: 17.6 cm           EFW: 76% GBS            Placenta localization: posterior           Fetal presentation: vertex Comments: Normal Fluid AFI 17.67cm=65th% Normal linear growth :EFW=83rd% 7.2 lbs = 3271 gms  Cervix not measured                    Anatomy survey is normal

## 2012-03-27 ENCOUNTER — Ambulatory Visit: Payer: Medicaid Other | Admitting: Obstetrics and Gynecology

## 2012-03-27 ENCOUNTER — Encounter: Payer: Self-pay | Admitting: Obstetrics and Gynecology

## 2012-03-27 VITALS — BP 92/60 | Wt 183.0 lb

## 2012-03-27 DIAGNOSIS — Z331 Pregnant state, incidental: Secondary | ICD-10-CM

## 2012-03-27 DIAGNOSIS — IMO0002 Reserved for concepts with insufficient information to code with codable children: Secondary | ICD-10-CM

## 2012-03-27 LAB — US OB FOLLOW UP

## 2012-03-27 NOTE — Progress Notes (Signed)
[redacted]w[redacted]d Pt has no complaints

## 2012-03-27 NOTE — Progress Notes (Signed)
37 +2 weeks  GFM GBS + reviewed with patient Ultrasound at [facility] for marginal cord insertion Labor signs reviewed

## 2012-03-27 NOTE — Progress Notes (Signed)
[redacted]w[redacted]d GBS positive

## 2012-04-03 ENCOUNTER — Ambulatory Visit: Payer: Medicaid Other | Admitting: Family Medicine

## 2012-04-03 VITALS — BP 90/64 | Wt 184.0 lb

## 2012-04-03 DIAGNOSIS — Z331 Pregnant state, incidental: Secondary | ICD-10-CM

## 2012-04-03 NOTE — Progress Notes (Signed)
[redacted]w[redacted]d Doing well, good fetal movement. Breast, plans for circumcision here in the office, and natural family planning. Car seat ready. Monitor fundal height has decreased by 2 cm in 2 weeks, patient scheduled for growth ultrasound this week. L.Carter, FNP-BC

## 2012-04-03 NOTE — Progress Notes (Signed)
[redacted]w[redacted]d No complaints today.

## 2012-04-06 ENCOUNTER — Ambulatory Visit: Payer: Medicaid Other

## 2012-04-06 ENCOUNTER — Ambulatory Visit: Payer: Medicaid Other | Admitting: Obstetrics and Gynecology

## 2012-04-06 ENCOUNTER — Encounter: Payer: Self-pay | Admitting: Obstetrics and Gynecology

## 2012-04-06 ENCOUNTER — Other Ambulatory Visit: Payer: Medicaid Other

## 2012-04-06 VITALS — BP 90/66 | Wt 185.0 lb

## 2012-04-06 DIAGNOSIS — IMO0002 Reserved for concepts with insufficient information to code with codable children: Secondary | ICD-10-CM

## 2012-04-06 NOTE — Progress Notes (Signed)
[redacted]w[redacted]d Pt w/o complaint, requests cervix check.  Ultrasound shows:  EFW 7lb 6oz %ILE 45.9        [place] EDD: 04/15/12            AFI: 12.72 cm            Cervical length: not done             Placenta localization: posterior            Fetal presentation: vertex    Anatomy survey completed yes    Anatomy survey is normal            Gender :     Comments: AFI normal, normal adenexas, marginal placental cord insertion noted Reviewed positive GBS

## 2012-04-07 ENCOUNTER — Encounter: Payer: Medicaid Other | Admitting: Obstetrics and Gynecology

## 2012-04-10 LAB — US OB FOLLOW UP

## 2012-04-11 ENCOUNTER — Ambulatory Visit: Payer: Medicaid Other

## 2012-04-11 VITALS — BP 94/62 | Wt 186.0 lb

## 2012-04-11 DIAGNOSIS — Z331 Pregnant state, incidental: Secondary | ICD-10-CM

## 2012-04-11 NOTE — Progress Notes (Signed)
[redacted]w[redacted]d; Pt does not have specific Birthplan for labor, but does desire to remain as mobile as possible.  Rev'd labor precautions and FKC. S.o. At appt.  EFW 8-9 lbs per Leopold's.  Pt not interested in induction unless post-dates.

## 2012-04-11 NOTE — Progress Notes (Signed)
Pt stated no issues today. Pt wants a cervix check today.

## 2012-04-20 ENCOUNTER — Inpatient Hospital Stay (HOSPITAL_COMMUNITY): Payer: Medicaid Other

## 2012-04-20 ENCOUNTER — Inpatient Hospital Stay (HOSPITAL_COMMUNITY)
Admission: AD | Admit: 2012-04-20 | Discharge: 2012-04-24 | DRG: 775 | Disposition: A | Discharge status: Home / Self Care | Payer: Medicaid Other | Source: Ambulatory Visit | Attending: Obstetrics and Gynecology | Admitting: Obstetrics and Gynecology

## 2012-04-20 ENCOUNTER — Encounter (HOSPITAL_COMMUNITY): Payer: Self-pay | Admitting: *Deleted

## 2012-04-20 DIAGNOSIS — F329 Major depressive disorder, single episode, unspecified: Secondary | ICD-10-CM | POA: Diagnosis present

## 2012-04-20 DIAGNOSIS — O9903 Anemia complicating the puerperium: Secondary | ICD-10-CM | POA: Diagnosis not present

## 2012-04-20 DIAGNOSIS — IMO0002 Reserved for concepts with insufficient information to code with codable children: Secondary | ICD-10-CM

## 2012-04-20 DIAGNOSIS — O480 Post-term pregnancy: Principal | ICD-10-CM | POA: Diagnosis present

## 2012-04-20 DIAGNOSIS — N926 Irregular menstruation, unspecified: Secondary | ICD-10-CM

## 2012-04-20 DIAGNOSIS — Z22330 Carrier of Group B streptococcus: Secondary | ICD-10-CM

## 2012-04-20 DIAGNOSIS — O99892 Other specified diseases and conditions complicating childbirth: Secondary | ICD-10-CM | POA: Diagnosis present

## 2012-04-20 DIAGNOSIS — D649 Anemia, unspecified: Secondary | ICD-10-CM | POA: Diagnosis not present

## 2012-04-20 DIAGNOSIS — O99344 Other mental disorders complicating childbirth: Secondary | ICD-10-CM | POA: Diagnosis present

## 2012-04-20 DIAGNOSIS — F988 Other specified behavioral and emotional disorders with onset usually occurring in childhood and adolescence: Secondary | ICD-10-CM

## 2012-04-20 DIAGNOSIS — O368390 Maternal care for abnormalities of the fetal heart rate or rhythm, unspecified trimester, not applicable or unspecified: Secondary | ICD-10-CM | POA: Diagnosis present

## 2012-04-20 DIAGNOSIS — F3289 Other specified depressive episodes: Secondary | ICD-10-CM | POA: Diagnosis present

## 2012-04-20 DIAGNOSIS — J45909 Unspecified asthma, uncomplicated: Secondary | ICD-10-CM

## 2012-04-20 HISTORY — DX: Maternal care for abnormalities of the fetal heart rate or rhythm, unspecified trimester, not applicable or unspecified: O36.8390

## 2012-04-20 NOTE — MAU Note (Signed)
Patient will be monitored in room 156 and have a repeat BPP in 4 hours. Renee, CN on Ante given status on patient.

## 2012-04-21 ENCOUNTER — Encounter (HOSPITAL_COMMUNITY): Payer: Self-pay

## 2012-04-21 DIAGNOSIS — O368390 Maternal care for abnormalities of the fetal heart rate or rhythm, unspecified trimester, not applicable or unspecified: Secondary | ICD-10-CM

## 2012-04-21 HISTORY — DX: Maternal care for abnormalities of the fetal heart rate or rhythm, unspecified trimester, not applicable or unspecified: O36.8390

## 2012-04-21 LAB — CBC
HCT: 31.5 % — ABNORMAL LOW (ref 36.0–46.0)
MCHC: 33.3 g/dL (ref 30.0–36.0)
RDW: 13.3 % (ref 11.5–15.5)

## 2012-04-21 LAB — RPR: RPR Ser Ql: NONREACTIVE

## 2012-04-21 MED ORDER — CITRIC ACID-SODIUM CITRATE 334-500 MG/5ML PO SOLN
30.0000 mL | ORAL | Status: DC | PRN
  Filled 2012-04-21: qty 15

## 2012-04-21 MED ORDER — OXYTOCIN 40 UNITS IN LACTATED RINGERS INFUSION - SIMPLE MED: 62.5000 mL/h | INTRAVENOUS | Status: DC

## 2012-04-21 MED ORDER — OXYTOCIN BOLUS FROM INFUSION: 500.0000 mL | INTRAVENOUS | Status: DC

## 2012-04-21 MED ORDER — HYDROXYZINE HCL 50 MG PO TABS
50.0000 mg | ORAL_TABLET | Freq: Four times a day (QID) | ORAL | Status: DC | PRN
  Administered 2012-04-22: 50 mg via ORAL
  Filled 2012-04-21 (×2): qty 1

## 2012-04-21 MED ORDER — LACTATED RINGERS IV SOLN
INTRAVENOUS | Status: DC
  Administered 2012-04-21 – 2012-04-22 (×7): via INTRAVENOUS

## 2012-04-21 MED ORDER — DIPHENHYDRAMINE HCL 25 MG PO CAPS: 25.0000 mg | ORAL_CAPSULE | ORAL | Status: DC | PRN

## 2012-04-21 MED ORDER — ACETAMINOPHEN 325 MG PO TABS: 650.0000 mg | ORAL_TABLET | ORAL | Status: DC | PRN

## 2012-04-21 MED ORDER — IBUPROFEN 600 MG PO TABS: 600.0000 mg | ORAL_TABLET | Freq: Four times a day (QID) | ORAL | Status: DC | PRN

## 2012-04-21 MED ORDER — OXYCODONE-ACETAMINOPHEN 5-325 MG PO TABS: 1.0000 | ORAL_TABLET | ORAL | Status: DC | PRN

## 2012-04-21 MED ORDER — DIPHENHYDRAMINE HCL 50 MG/ML IJ SOLN: 25.0000 mg | Freq: Four times a day (QID) | INTRAMUSCULAR | Status: DC | PRN

## 2012-04-21 MED ORDER — ZOLPIDEM TARTRATE 5 MG PO TABS: 5.0000 mg | ORAL_TABLET | Freq: Every evening | ORAL | Status: DC | PRN

## 2012-04-21 MED ORDER — SODIUM CHLORIDE 0.9 % IJ SOLN: 3.0000 mL | Freq: Two times a day (BID) | INTRAMUSCULAR | Status: DC

## 2012-04-21 MED ORDER — FENTANYL CITRATE 0.05 MG/ML IJ SOLN
100.0000 ug | INTRAMUSCULAR | Status: DC | PRN
  Administered 2012-04-22: 100 ug via INTRAVENOUS
  Filled 2012-04-21: qty 2

## 2012-04-21 MED ORDER — PENICILLIN G POTASSIUM 5000000 UNITS IJ SOLR
2.5000 10*6.[IU] | INTRAVENOUS | Status: DC
  Administered 2012-04-21 – 2012-04-22 (×6): 2.5 10*6.[IU] via INTRAVENOUS
  Filled 2012-04-21 (×9): qty 2.5

## 2012-04-21 MED ORDER — OXYTOCIN 40 UNITS IN LACTATED RINGERS INFUSION - SIMPLE MED
1.0000 m[IU]/min | INTRAVENOUS | Status: DC
  Administered 2012-04-21: 1 m[IU]/min via INTRAVENOUS
  Filled 2012-04-21: qty 1000

## 2012-04-21 MED ORDER — LACTATED RINGERS IV SOLN
500.0000 mL | INTRAVENOUS | Status: DC | PRN
  Administered 2012-04-21 (×2): 500 mL via INTRAVENOUS
  Administered 2012-04-22: 700 mL via INTRAVENOUS
  Administered 2012-04-22: 500 mL via INTRAVENOUS
  Administered 2012-04-22: 1000 mL via INTRAVENOUS

## 2012-04-21 MED ORDER — ONDANSETRON HCL 4 MG/2ML IJ SOLN: 4.0000 mg | Freq: Four times a day (QID) | INTRAMUSCULAR | Status: DC | PRN

## 2012-04-21 MED ORDER — TERBUTALINE SULFATE 1 MG/ML IJ SOLN: 0.2500 mg | Freq: Once | INTRAMUSCULAR | Status: AC | PRN

## 2012-04-21 MED ORDER — DEXTROSE 5 % IV SOLN
5.0000 10*6.[IU] | Freq: Once | INTRAVENOUS | Status: AC
  Administered 2012-04-21: 5 10*6.[IU] via INTRAVENOUS
  Filled 2012-04-21: qty 5

## 2012-04-21 MED ORDER — SODIUM CHLORIDE 0.9 % IJ SOLN: 3.0000 mL | INTRAMUSCULAR | Status: DC | PRN

## 2012-04-21 MED ORDER — LIDOCAINE HCL (PF) 1 % IJ SOLN
30.0000 mL | INTRAMUSCULAR | Status: DC | PRN
  Filled 2012-04-21: qty 30

## 2012-04-21 MED ORDER — SODIUM CHLORIDE 0.9 % IV SOLN: 250.0000 mL | INTRAVENOUS | Status: DC | PRN

## 2012-04-21 NOTE — Progress Notes (Signed)
Subjective: Pt was sitting in rocking chair w/oxygen on, but rec'd pt try some exaggerated Sims positions.  Pitocin on 1mu.  S.o. Remains supportive at bs.  Objective: BP 113/71  Pulse 113  Temp(Src) 100 F (37.8 C) (Axillary)  Resp 18  Ht 5\' 7"  (1.702 m)  Wt 186 lb (84.369 kg)  BMI 29.12 kg/m2  SpO2 89%  LMP 07/10/2011      FHT:  FHR: 155 bpm, variability: moderate,  accelerations:  Present,  decelerations:  Present run of lates over last half hr UC:   regular, every 1.5-4 minutes; couplets, quadruplets at times SVE:   Dilation: 3 Effacement (%): 70 Station: -2 Exam by:: h. Miku Udall, cnm Foley bulb d/c'd w/o difficulty. Labs: Lab Results  Component Value Date   WBC 10.7* 04/21/2012   HGB 10.5* 04/21/2012   HCT 31.5* 04/21/2012   MCV 87.3 04/21/2012   PLT 181 04/21/2012    Assessment / Plan: 1. [redacted]w[redacted]d 2. IOL due to decels 3. dysfunctional labor pattern 4. GBS pos  Labor: latent Preeclampsia:  no signs or symptoms of toxicity Fetal Wellbeing:  Category II Pain Control:  Labor support without medications I/D:  n/a Anticipated MOD:  NSVD 1. Will continue to titrate pitocin as FHT permits, and AROM prn further augmentation. 2. C/w MD prn.  Nancye Grumbine H 04/21/2012, 10:00 PM

## 2012-04-21 NOTE — Progress Notes (Signed)
Subjective: Pt agreed to begin Pitocin since no cervical progression and Foley bulb still in place.  Occ'l periods of lates, but resolve w/ interventions.  Small amts of bloody show at times, but no LOF.  Objective: BP 120/68  Pulse 123  Temp(Src) 98.6 F (37 C) (Oral)  Resp 20  Ht 5\' 7"  (1.702 m)  Wt 186 lb (84.369 kg)  BMI 29.12 kg/m2  SpO2 89%  LMP 07/10/2011      FHT:  FHR: 155 bpm, variability: moderate,  accelerations:  Present,  decelerations:  Present occ'l lates vs variables UC:   regular, every 2-4 minutes; coupleting a lot SVE:   Dilation: 3 Effacement (%): 70 Station: -2 Exam by:: h. Tabby Beaston, cnm  Labs: Lab Results  Component Value Date   WBC 10.7* 04/21/2012   HGB 10.5* 04/21/2012   HCT 31.5* 04/21/2012   MCV 87.3 04/21/2012   PLT 181 04/21/2012    Assessment / Plan: 1. [redacted]w[redacted]d 2. IOL secondary to decels and 6/8 BPP 3. GBS pos  Labor: latent Preeclampsia:  no signs or symptoms of toxicity Fetal Wellbeing:  Category II Pain Control:  Labor support without medications I/D:  n/a Anticipated MOD:  NSVD 1. CTO FHT closely as proceed w/ Pitocin augmentation 2. C/w MD prn  Taysen Bushart H 04/21/2012, 9:20 PM

## 2012-04-21 NOTE — Progress Notes (Signed)
  Subjective: Pt resting comfortably in bed.  Pt notes UCs but they are not "bad" - pt coping well.  Foley bulb still in place.  Objective: BP 110/73  Pulse 95  Temp(Src) 97.7 F (36.5 C) (Oral)  Resp 18  Ht 5\' 7"  (1.702 m)  Wt 186 lb (84.369 kg)  BMI 29.12 kg/m2  LMP 07/10/2011    FHT:  Cat I UC:   Irregular, mild SVE:   2cm / 70 / -2  per RN  Assessment / Plan: IUP at [redacted]w[redacted]d  IOL due to decels and 6/8 BPP  GBS pos  Continue with foley bulb CTO labor progress    Haroldine Laws CNM, MSN 04/21/2012, 3:27 PM

## 2012-04-21 NOTE — H&P (Signed)
Erin Ayala is a 27 y.o. white female presenting at [redacted]w[redacted]d from office visit for prolonged monitoring and repeat BPP.  Seen at office by Dr. Estanislado Pandy for scheduled visit, and had routine, post-term u/s for BPP and scored 4/8 (no fetal movement or fetal breathing movements).  AFI Nml and EFW=8.5 lbs per report from dayshift CNM.  Plan made to repeat BPP approximately 4 hrs after one done at office, which was around 2030.  Pt admitted to antenatal room b/c of prolonged monitoring and MAU full.  Cx at office=1/60/-1 per Dr. Estanislado Pandy.   Pt denies any recent illness or fever.  Denies LOF or Vb.  FM has been WNL and unchanged.  No UTI or PIH s/s.  Normal BM's.  No recent change in diet.  No recent trips.  No resp or GI c/o's.    Prenatal course: Pt entered care at [redacted]w[redacted]d at [facility].  Quad screen and pap normal.  Anatomy scan at [redacted]w[redacted]d; WNL except placenta w/ marginal cord/circumvallate insertion.  Pt had a lapse in care from [redacted]w[redacted]d until 105w3d b/c of insurance issues.  Pt declined flu vaccine.  Normal 1hr gtt. She gained approximately 55 lbs during pregnancy.  She received serial growth u/s during 3rd trimester for the marginal insertion, w/ most recent yesterday at office.  Growth was linear on each scan.      Maternal Medical History:  Contractions: Frequency: irregular.   Perceived severity is mild.    Fetal activity: Perceived fetal activity is normal.   Last perceived fetal movement was within the past hour.      OB History   Grav Para Term Preterm Abortions TAB SAB Ect Mult Living   1              Past Medical History  Diagnosis Date  . Asthma high school    Exercised induced  . ADHD (attention deficit hyperactivity disorder)   . Depression    Past Surgical History  Procedure Laterality Date  . No past surgeries     Family History: family history includes Anxiety disorder in her sister; Depression in her brother and father; and Seizures in her mother. Social History:  reports that she has  never smoked. She has never used smokeless tobacco. She reports that she does not drink alcohol or use illicit drugs. Pt is unemployed.  Significant other, "Prudencio Burly" is involved and supportive.   Prenatal Transfer Tool  Maternal Diabetes: No Genetic Screening: Normal Maternal Ultrasounds/Referrals: Abnormal:  Findings:   Other: Fetal Ultrasounds or other Referrals:  None Maternal Substance Abuse:  No Significant Maternal Medications:  None Significant Maternal Lab Results:  Lab values include: Group B Strep positive Other Comments:  4/8 BPP at office; repeated after prolonged monitoring and 6/8 and rec'd IOL.  Pt declined flu vaccine.  Review of Systems  Constitutional: Negative.   HENT: Negative.   Eyes: Negative.   Respiratory: Negative.   Cardiovascular: Negative.   Gastrointestinal: Negative.   Genitourinary: Negative.   Skin: Negative.   Neurological: Negative.     Dilation: 1 Effacement (%): 80 Station: -2 Exam by:: H. Katelan Hirt, cnm Blood pressure 112/70, pulse 86, temperature 97.7 F (36.5 C), temperature source Oral, resp. rate 18, height 5\' 7"  (1.702 m), weight 186 lb (84.369 kg), last menstrual period 07/10/2011. Maternal Exam:  Uterine Assessment: Contraction strength is mild.  Contraction frequency is irregular.   Abdomen: Patient reports no abdominal tenderness. Fetal presentation: vertex  Introitus: Normal vulva. Pelvis: adequate for delivery.   Cervix:  Cervix evaluated by digital exam.     Fetal Exam Fetal Monitor Review: Mode: ultrasound.   Baseline rate: 140.  Variability: moderate (6-25 bpm).   Pattern: accelerations present, late decelerations and variable decelerations.    Fetal State Assessment: Category II - tracings are indeterminate.     Physical Exam  Constitutional: She is oriented to person, place, and time. She appears well-developed and well-nourished. No distress.  HENT:  Head: Normocephalic and atraumatic.  Eyes: Pupils are equal,  round, and reactive to light.  Cardiovascular: Normal rate.   Respiratory: Effort normal.  GI: Soft.  gravid  Genitourinary:  Cx: tight 1cm/80/-2, mid position, vtx  Musculoskeletal: She exhibits no edema.  Neurological: She is alert and oriented to person, place, and time. She has normal reflexes.  Skin: Skin is warm and dry.  Psychiatric: She has a normal mood and affect. Her behavior is normal. Judgment and thought content normal.    Prenatal labs: ABO, Rh: A/POS/-- (09/13 0939) Antibody: NEG (09/13 0939) Rubella: 31.6 (09/13 0939) RPR: NON REAC (01/20 1115)  HBsAg: NEGATIVE (09/13 0939)  HIV: NON REACTIVE (09/13 0939)  GBS: Positive (01/30 0000)  1hr gtt=122 Pap negative and cx's on 10/11/12  Assessment/Plan: 1. [redacted]w[redacted]d 2. Repeat BPP 6/8, and decels noted w/ a few contractions during extended monitoring 3. Cat II FHT 4. GBS pos 5. H/o depression/ADHD  1. In light of FHT since in hospital and 6/8 BPP, per c/w Dr. Stefano Gaul, recommended pt proceed w/ IOL. R/b/a rev'd, including option of expectant management/d/c home, but did not recommend b/c unable to monitor Fetal status outside of hospital.  Following discussion, all questions answered, and pt and s.o. Agreeable to proceed with IOL.  Pt's s.o. Desires to return home to get things packed, as they had not intended on admission, and to await his return to hospital before starting induction. 2. Admit to BS w/ Dr. Stefano Gaul as attending 3. Routine L&D orders, w/ PCN for GBS  4. Will try to insert Foley bulb, and add pitocin in time; if unable to thread foley, will start low-dose pitocin. 5. Support as needed 6. Enc'd pt to try to rest tonight and did discuss recommendation for continuous monitoring during process 7. C/w MD prn  Paeton Studer H 04/21/2012, 12:14 AM

## 2012-04-21 NOTE — Progress Notes (Signed)
  Subjective: Pt sitting comfortably in rocking chair.    Objective: BP 110/73  Pulse 95  Temp(Src) 97.7 F (36.5 C) (Oral)  Resp 18  Ht 5\' 7"  (1.702 m)  Wt 186 lb (84.369 kg)  BMI 29.12 kg/m2  LMP 07/10/2011    FHT:  Cat II UC:   regular, every 2-6 minutes SVE:   Dilation: 3 Effacement (%): 70 Station: -2 Exam by:: Santina Evans stout RN  Assessment / Plan: IUP at [redacted]w[redacted]d  IOL due to decels and 6/8 BPP  GBS pos   Continue with foley bulb  Continue GBS prophylaxis CTO labor progress    Haroldine Laws CNM, MSN 04/21/2012, 3:34 PM

## 2012-04-21 NOTE — Progress Notes (Signed)
Subjective: Pt standing at bs, swaying, breathing w/ ctxs.  Pitocin on 2mu.  Husband remains supportive and at bs.  Temp has elevated, so pt is receiving IVFB now and will CTO closely.  Pt declines pain interventions at present.  Small amts of bloody show, no LOF.  Objective: BP 114/65  Pulse 119  Temp(Src) 100 F (37.8 C) (Axillary)  Resp 20  Ht 5\' 7"  (1.702 m)  Wt 186 lb (84.369 kg)  BMI 29.12 kg/m2  SpO2 89%  LMP 07/10/2011      FHT:  FHR: 155 bpm, variability: moderate,  accelerations:  Present,  decelerations:  Present intermittent sublte late, variable UC:   irregular, every 1.5-4 minutes SVE:   Dilation: 3 Effacement (%): 70 Station: -2 Exam by:: h. Dazia Lippold, cnm  Labs: Lab Results  Component Value Date   WBC 10.7* 04/21/2012   HGB 10.5* 04/21/2012   HCT 31.5* 04/21/2012   MCV 87.3 04/21/2012   PLT 181 04/21/2012    Assessment / Plan: 1. [redacted]w[redacted]d 2. IOL for decels & 6/8 repeat BPP 3. GBS pos  Labor: Progressing on Pitocin, will continue to increase then AROM Preeclampsia:  no signs or symptoms of toxicity Fetal Wellbeing:  Category II Pain Control:  Labor support without medications I/D:  n/a Anticipated MOD:  NSVD 1. enc'd pt to rest on side from time to time and has been changing positions frequently. 2. Support as needed; will CTO temp and FHT closely. 3. C/w MD prn  Daison Braxton H 04/21/2012, 11:45 PM

## 2012-04-21 NOTE — Progress Notes (Signed)
  Subjective: Pt still comfortable.  Objective: BP 88/64  Pulse 87  Temp(Src) 99.4 F (37.4 C) (Oral)  Resp 20  Ht 5\' 7"  (1.702 m)  Wt 186 lb (84.369 kg)  BMI 29.12 kg/m2  LMP 07/10/2011     FHT:  Cat II UC:   regular, every 2-6 minutes SVE:   Dilation: 3 Effacement (%): 70 Station: -2 Exam by:: J. Yoltzin Ransom CNM Foley bulb still in  Assessment / Plan: IUP at [redacted]w[redacted]d IOL - with foley bulb GBS pos Recurrent lates, good variability  Placed in left lateral position Rv'd possible need for FSE with AROM - pt prefers to avoid at this time - rev'd why FSE is need Will CTO FHT and labor progress   Haroldine Laws CNM, MSN 04/21/2012, 4:25 PM

## 2012-04-21 NOTE — Progress Notes (Signed)
Subjective: Pt transferred to room 170 and s.o. Returned from getting things at home.  Pt w/o c/o's.  Declines sleep aid at present.    Objective: BP 121/73  Pulse 75  Temp(Src) 97.9 F (36.6 C) (Oral)  Resp 18  Ht 5\' 7"  (1.702 m)  Wt 186 lb (84.369 kg)  BMI 29.12 kg/m2  LMP 07/10/2011      FHT:  FHR: 125 bpm, variability: moderate,  accelerations:  Present,  decelerations:  Absent UC:   irregular, every 3-6 minutes SVE:   1/70/-1, anterior Foley bulb inserted and balloon filled; BRB noted in bulb catheter as balloon filled, but not actively bleeding. Labs: Lab Results  Component Value Date   WBC 10.7* 04/21/2012   HGB 10.5* 04/21/2012   HCT 31.5* 04/21/2012   MCV 87.3 04/21/2012   PLT 181 04/21/2012    Assessment / Plan: 1. [redacted]w[redacted]d 2. IOL due to decels and 6/8 BPP 3. GBS pos  Labor: induction started w/ foley now Preeclampsia:  no signs or symptoms of toxicity Fetal Wellbeing:  Category I Pain Control:  Labor support without medications I/D:  n/a Anticipated MOD:  NSVD 1. Per c/w dr. Stefano Gaul, will start w/ foley, and add pitocin as time goes forward so as not to introduce too much stimuli at once. 2. Start PCN now for GBS prophylaxis. 3. C/w MD prn 4. CTO pt and FHT closely  Erin Ayala H 04/21/2012, 2:09 AM

## 2012-04-22 ENCOUNTER — Inpatient Hospital Stay (HOSPITAL_COMMUNITY): Payer: Medicaid Other | Admitting: Anesthesiology

## 2012-04-22 ENCOUNTER — Encounter (HOSPITAL_COMMUNITY): Payer: Self-pay | Admitting: Anesthesiology

## 2012-04-22 ENCOUNTER — Encounter (HOSPITAL_COMMUNITY): Payer: Self-pay

## 2012-04-22 MED ORDER — ONDANSETRON HCL 4 MG PO TABS: 4.0000 mg | ORAL_TABLET | ORAL | Status: DC | PRN

## 2012-04-22 MED ORDER — OXYCODONE-ACETAMINOPHEN 5-325 MG PO TABS
1.0000 | ORAL_TABLET | ORAL | Status: DC | PRN
Start: 2012-04-22 — End: 2012-04-24

## 2012-04-22 MED ORDER — LANOLIN HYDROUS EX OINT: TOPICAL_OINTMENT | CUTANEOUS | Status: DC | PRN

## 2012-04-22 MED ORDER — FENTANYL 2.5 MCG/ML BUPIVACAINE 1/10 % EPIDURAL INFUSION (WH - ANES)
14.0000 mL/h | INTRAMUSCULAR | Status: DC | PRN
  Administered 2012-04-22: 14 mL/h via EPIDURAL
  Filled 2012-04-22: qty 125

## 2012-04-22 MED ORDER — BENZOCAINE-MENTHOL 20-0.5 % EX AERO: 1.0000 "application " | INHALATION_SPRAY | CUTANEOUS | Status: DC | PRN

## 2012-04-22 MED ORDER — TETANUS-DIPHTH-ACELL PERTUSSIS 5-2.5-18.5 LF-MCG/0.5 IM SUSP: 0.5000 mL | Freq: Once | INTRAMUSCULAR | Status: DC

## 2012-04-22 MED ORDER — MEASLES, MUMPS & RUBELLA VAC ~~LOC~~ INJ
0.5000 mL | INJECTION | Freq: Once | SUBCUTANEOUS | Status: DC
  Filled 2012-04-22: qty 0.5

## 2012-04-22 MED ORDER — SIMETHICONE 80 MG PO CHEW: 80.0000 mg | CHEWABLE_TABLET | ORAL | Status: DC | PRN

## 2012-04-22 MED ORDER — PHENYLEPHRINE 40 MCG/ML (10ML) SYRINGE FOR IV PUSH (FOR BLOOD PRESSURE SUPPORT)
80.0000 ug | PREFILLED_SYRINGE | INTRAVENOUS | Status: DC | PRN
  Administered 2012-04-22: 80 ug via INTRAVENOUS
  Filled 2012-04-22: qty 5
  Filled 2012-04-22: qty 2

## 2012-04-22 MED ORDER — PRENATAL MULTIVITAMIN CH
1.0000 | ORAL_TABLET | Freq: Every day | ORAL | Status: DC
  Administered 2012-04-22 – 2012-04-24 (×3): 1 via ORAL

## 2012-04-22 MED ORDER — DIPHENHYDRAMINE HCL 25 MG PO CAPS: 25.0000 mg | ORAL_CAPSULE | Freq: Four times a day (QID) | ORAL | Status: DC | PRN

## 2012-04-22 MED ORDER — PROMETHAZINE HCL 25 MG/ML IJ SOLN
12.5000 mg | INTRAMUSCULAR | Status: DC | PRN
  Administered 2012-04-22: 12.5 mg via INTRAVENOUS
  Filled 2012-04-22: qty 1

## 2012-04-22 MED ORDER — IBUPROFEN 600 MG PO TABS
600.0000 mg | ORAL_TABLET | Freq: Four times a day (QID) | ORAL | Status: DC
  Administered 2012-04-22 – 2012-04-24 (×9): 600 mg via ORAL
  Filled 2012-04-22 (×9): qty 1

## 2012-04-22 MED ORDER — MEDROXYPROGESTERONE ACETATE 150 MG/ML IM SUSP: 150.0000 mg | INTRAMUSCULAR | Status: DC | PRN

## 2012-04-22 MED ORDER — PANTOPRAZOLE SODIUM 40 MG PO TBEC
40.0000 mg | DELAYED_RELEASE_TABLET | Freq: Every day | ORAL | Status: DC
  Administered 2012-04-24: 40 mg via ORAL
  Filled 2012-04-22 (×4): qty 1

## 2012-04-22 MED ORDER — PRENATAL MULTIVITAMIN CH
1.0000 | ORAL_TABLET | Freq: Every morning | ORAL | Status: DC
  Filled 2012-04-22 (×3): qty 1

## 2012-04-22 MED ORDER — DIPHENHYDRAMINE HCL 50 MG/ML IJ SOLN: 12.5000 mg | INTRAMUSCULAR | Status: DC | PRN

## 2012-04-22 MED ORDER — EPHEDRINE 5 MG/ML INJ
10.0000 mg | INTRAVENOUS | Status: DC | PRN
  Filled 2012-04-22: qty 2

## 2012-04-22 MED ORDER — WITCH HAZEL-GLYCERIN EX PADS: 1.0000 "application " | MEDICATED_PAD | CUTANEOUS | Status: DC | PRN

## 2012-04-22 MED ORDER — SODIUM BICARBONATE 8.4 % IV SOLN
INTRAVENOUS | Status: DC | PRN
  Administered 2012-04-22: 5 mL via EPIDURAL

## 2012-04-22 MED ORDER — ZOLPIDEM TARTRATE 5 MG PO TABS: 5.0000 mg | ORAL_TABLET | Freq: Every evening | ORAL | Status: DC | PRN

## 2012-04-22 MED ORDER — LACTATED RINGERS IV SOLN
500.0000 mL | Freq: Once | INTRAVENOUS | Status: AC
  Administered 2012-04-22: 500 mL via INTRAVENOUS

## 2012-04-22 MED ORDER — BUTORPHANOL TARTRATE 1 MG/ML IJ SOLN
1.0000 mg | INTRAMUSCULAR | Status: DC | PRN
  Administered 2012-04-22: 1 mg via INTRAVENOUS
  Filled 2012-04-22: qty 1

## 2012-04-22 MED ORDER — SENNOSIDES-DOCUSATE SODIUM 8.6-50 MG PO TABS
2.0000 | ORAL_TABLET | Freq: Every day | ORAL | Status: DC
  Administered 2012-04-22: 1 via ORAL
  Administered 2012-04-23: 2 via ORAL

## 2012-04-22 MED ORDER — PHENYLEPHRINE 40 MCG/ML (10ML) SYRINGE FOR IV PUSH (FOR BLOOD PRESSURE SUPPORT)
80.0000 ug | PREFILLED_SYRINGE | INTRAVENOUS | Status: DC | PRN
  Filled 2012-04-22: qty 2

## 2012-04-22 MED ORDER — EPHEDRINE 5 MG/ML INJ
10.0000 mg | INTRAVENOUS | Status: DC | PRN
  Filled 2012-04-22: qty 2
  Filled 2012-04-22: qty 4

## 2012-04-22 MED ORDER — ONDANSETRON HCL 4 MG/2ML IJ SOLN: 4.0000 mg | INTRAMUSCULAR | Status: DC | PRN

## 2012-04-22 MED ORDER — DIBUCAINE 1 % RE OINT: 1.0000 "application " | TOPICAL_OINTMENT | RECTAL | Status: DC | PRN

## 2012-04-22 NOTE — Progress Notes (Signed)
Dr Stefano Gaul at bedside evaluating cervix dialation & fetal heart rate being intolerant to labor. Patient pushed with Dr Stefano Gaul at bedside to evaluate decent of fetus with pushing/contracitons. Due to fetus appearing to be intolerant to labor as evidenced by late decelerations & prolonged decelerations dropping to 60bpm with each contraction. Dr Stefano Gaul discussed cesarean section with patient and significant other; reviewed benefits of delivering via c/s, and risks associated with c/s. Dr Stefano Gaul informed patient of risks to fetus if patient continues to push and fetus continues to have current decelerations. Patient was given opportunity to decide to continue to push or go ahead with c/s. Patient chose to continue to push with option of changing her mind later, and accepted that there could be potential harm to fetus continuing this way. Eustace Pen, CNM continued to stay at bedside, along with RN, and continued to push per patients desire.

## 2012-04-22 NOTE — Anesthesia Postprocedure Evaluation (Signed)
  Anesthesia Post-op Note  Patient: Erin Ayala  Procedure(s) Performed: * No procedures listed *  Patient Location: Mother/Baby  Anesthesia Type:Epidural  Level of Consciousness: awake  Airway and Oxygen Therapy: Patient Spontanous Breathing  Post-op Pain: mild  Post-op Assessment: Patient's Cardiovascular Status Stable and Respiratory Function Stable  Post-op Vital Signs: stable  Complications: No apparent anesthesia complications

## 2012-04-22 NOTE — Progress Notes (Signed)
Subjective: Sitting for epidural now.  Groggy and sleepy in between ctxs, but little relief from IV meds during ctxs.  Husband remains at bs.  Objective: BP 129/84  Pulse 98  Temp(Src) 98.6 F (37 C) (Axillary)  Resp 18  Ht 5\' 7"  (1.702 m)  Wt 186 lb (84.369 kg)  BMI 29.12 kg/m2  SpO2 98%  LMP 07/10/2011      FHT:  FHR: 135 bpm, variability: moderate,  accelerations:  Present,  decelerations:  Absent UC:   regular, every 2-3 minutes SVE:   Dilation: 4.5 Effacement (%): 90 Station: -1 Exam by:: h. Erin Ayala, cnm Deferred cervical exam at present Labs: Lab Results  Component Value Date   WBC 10.7* 04/21/2012   HGB 10.5* 04/21/2012   HCT 31.5* 04/21/2012   MCV 87.3 04/21/2012   PLT 181 04/21/2012    Assessment / Plan: Induction of labor due to non-reassuring fetal testing,  progressing well on pitocin  Labor: Progressing on Pitocin, will continue to increase then AROM Preeclampsia:  no signs or symptoms of toxicity Fetal Wellbeing:  Category I Pain Control:  Epidural I/D:  n/a Anticipated MOD:  NSVD 1. Will recheck cx after comfortable.  Continue current POC.  Erin Ayala H 04/22/2012, 3:37 AM

## 2012-04-22 NOTE — Progress Notes (Signed)
I was asked to evaluate the patient because of fetal heart rate decelerations.  Cervix is 9+ centimeters/100%/1+ station, and occiput posterior.  Baseline fetal heart rate is 140-150 bpm. Accelerations noted. Variability is 6-25 beats per minute. Variable decelerations noted with each contraction. Some decelerations are to 60 beats per minute. Category 2.  The patient was allowed to push. Cervix now 10 cm dilated and a +1 station.  The patient has been positioned from side to side. Oxygen has been given. An amnioinfusion has been started.  Management options discussed. I do not recommend operative vaginal delivery at this point. Cesarean section recommended. Risk and benefits reviewed. The patient declines and wants to continue pushing. The patient and her husband were told of the potential morbidity to her baby. She was told that I cannot tell them how long it is safe to push. She was told that I cannot tell them when damage will occur to her baby. She was told that she must let me know when she is ready to proceed with cesarean delivery.  Dr. Stefano Gaul

## 2012-04-22 NOTE — Progress Notes (Signed)
Infant placed skin to skin at [facility]

## 2012-04-22 NOTE — Progress Notes (Signed)
Subjective: Pt just received 1mg  IV Stadol and 12.5mg  IV Phenergan.  "Tired."  Had episode of n/v just before IV meds.  Continued bloody show, but no LOF. Pitocin on 2mu.  Pt's pain is 9/10 and primarily in her back now. Husband remains at bs and supportive.  Pt had received IV Fentanyl and Vistaril about 1.5 hrs ago w/ little relief.  If current meds do not help pt rest, she is considering epidural.  Objective: BP 104/62  Pulse 96  Temp(Src) 98.6 F (37 C) (Axillary)  Resp 18  Ht 5\' 7"  (1.702 m)  Wt 186 lb (84.369 kg)  BMI 29.12 kg/m2  SpO2 89%  LMP 07/10/2011      FHT:  FHR: 145 bpm, variability: moderate,  accelerations:  Present,  decelerations:  Absent UC:   regular, every 1.5-3 minutes SVE:   Dilation: 4.5 Effacement (%): 90 Station: -1 Exam by:: h. Leon Montoya, cnm Small amt of show; BBOW, anterior Labs: Lab Results  Component Value Date   WBC 10.7* 04/21/2012   HGB 10.5* 04/21/2012   HCT 31.5* 04/21/2012   MCV 87.3 04/21/2012   PLT 181 04/21/2012    Assessment / Plan: Induction of labor due to non-reassuring fetal testing,  progressing well on pitocin  Labor: Progressing on Pitocin, will continue to increase then AROM Preeclampsia:  no signs or symptoms of toxicity Fetal Wellbeing:  Category I Pain Control:  Fentanyl I/D:  n/a Anticipated MOD:  NSVD 1. Continue w/ current POC.  Pt placed in Rt exaggerated Sims.  C/w MD prn. Marcellis Frampton H 04/22/2012, 1:41 AM

## 2012-04-22 NOTE — Anesthesia Procedure Notes (Signed)

## 2012-04-22 NOTE — Anesthesia Preprocedure Evaluation (Addendum)
Anesthesia Evaluation  Patient identified by MRN, date of birth, ID band Patient awake    Reviewed: Allergy & Precautions, H&P , Patient's Chart, lab work & pertinent test results  Airway Mallampati: II TM Distance: >3 FB Neck ROM: full    Dental  (+) Teeth Intact   Pulmonary  breath sounds clear to auscultation        Cardiovascular Rhythm:regular Rate:Normal     Neuro/Psych    GI/Hepatic   Endo/Other    Renal/GU      Musculoskeletal   Abdominal   Peds  Hematology   Anesthesia Other Findings       Reproductive/Obstetrics (+) Pregnancy                          Anesthesia Physical Anesthesia Plan  ASA: II  Anesthesia Plan: Bier Block   Post-op Pain Management:    Induction:   Airway Management Planned:   Additional Equipment:   Intra-op Plan:   Post-operative Plan:   Informed Consent: I have reviewed the patients History and Physical, chart, labs and discussed the procedure including the risks, benefits and alternatives for the proposed anesthesia with the patient or authorized representative who has indicated his/her understanding and acceptance.   Dental Advisory Given  Plan Discussed with:   Anesthesia Plan Comments: (Labs checked- platelets confirmed with RN in room. Fetal heart tracing, per RN, reported to be stable enough for sitting procedure. Discussed epidural, and patient consents to the procedure:  included risk of possible headache,backache, failed block, allergic reaction, and nerve injury. This patient was asked if she had any questions or concerns before the procedure started. )        Anesthesia Quick Evaluation

## 2012-04-23 LAB — CBC
Platelets: 157 10*3/uL (ref 150–400)
RBC: 3.1 MIL/uL — ABNORMAL LOW (ref 3.87–5.11)
WBC: 12.3 10*3/uL — ABNORMAL HIGH (ref 4.0–10.5)

## 2012-04-23 NOTE — Progress Notes (Signed)
Post Partum Day 1 s/p Vacuum by AVS for NRFHT w/ 2nd degree (vaginal and Bilateral labial) Subjective: up ad lib, voiding, tolerating PO, + flatus and struggling some w/ BF'ng as newborn is sleepy at breast.  Pt sleeping on my arrival, along w/ husband and newborn in bassinet.  VB stable.  Motrin controlling pain well.  Tired. No dizziness.  Objective: Blood pressure 96/62, pulse 90, temperature 98 F (36.7 C), temperature source Oral, resp. rate 18, height 5\' 7"  (1.702 m), weight 186 lb (84.369 kg), last menstrual period 07/10/2011, SpO2 97.00%, unknown if currently breastfeeding.  Physical Exam:  General: alert, cooperative, appears stated age, fatigued and no distress Lochia: appropriate Uterine Fundus: firm, below umbilicus Incision: n/a DVT Evaluation: No evidence of DVT seen on physical exam. Negative Homan's sign. No significant calf/ankle edema.   Recent Labs  04/21/12 0015 04/23/12 0635  HGB 10.5* 8.9*  HCT 31.5* 27.5*    Assessment/Plan: Plan for discharge tomorrow, Breastfeeding and Lactation consult   LOS: 3 days   Shawnika Pepin H 04/23/2012, 12:04 PM

## 2012-04-23 NOTE — Clinical Social Work Note (Signed)
CSW spoke with MOB about hx of depression and anxiety.  MOB reports being on wellbutrin 3 years ago, she currently is diagnosed ADHD and was prescribed adderall to control symptoms.  MOB reports this helps with anxiety and depression as well.  No current emotional concerns reported by [facility] or RN.  Patient was referred for history of depression/anxiety.  * Referral screened out by Clinical Social Worker because none of the following criteria appear to apply:  ~ History of anxiety/depression during this pregnancy, or of post-partum depression. ~ Diagnosis of anxiety and/or depression within last 3 years ~ History of depression due to pregnancy loss/loss of child  OR  * Patient's symptoms currently being treated with medication and/or therapy.  Please contact the Clinical Social Worker if needs arise, or by the patient's request.

## 2012-04-24 MED ORDER — IBUPROFEN 600 MG PO TABS: 600.0000 mg | ORAL_TABLET | Freq: Four times a day (QID) | ORAL | Status: DC

## 2012-04-24 MED ORDER — FERROUS SULFATE 325 (65 FE) MG PO TABS: 325.0000 mg | ORAL_TABLET | Freq: Every day | ORAL | Status: DC

## 2012-04-24 NOTE — Discharge Summary (Signed)
  Vaginal Delivery Discharge Summary  Erin Ayala  DOB:    05-Mar-1985 MRN:    [id number] CSN:    [id number]  Date of admission:                  04/20/12  Date of discharge:                   04/24/12  Procedures this admission:  Vacuum Assisted Vaginal Delivery with a 2nd Degree Perineal Laceration  Newborn Data:  Live born female  Birth Weight: 7 lb 12.3 oz (3525 g) APGAR: 8, 9  Home with mother. Name: "Erin Ayala"  History of Present Illness:  [name] is a 27 y.o. female, G1P1001, who presents at [redacted]w[redacted]d weeks gestation. The patient has been followed at the [facility]cology division of [facility]. She was admitted for a repeat BPP and observation after a post dates BPP in office of 4/10 . Her pregnancy has been complicated by:   1) A marginal cord insertion  Patient Active Problem List  Diagnosis  . Asthma  . ADD (attention deficit disorder)  . Depression  . Irregular periods/menstrual cycles  . Vacuum extractor delivery, delivered   Hospital course:  The patient was admitted for IOL for NRFHT and PD.   Her labor was complicated by episodes of NRFHT. The second stage of labor was complicated by variable decelerations, however she proceeded to have a vacuum assisted vaginal delivery of a healthy infant. Her delivery was not complicated. Her postpartum course was not complicated.  She was discharged to home on postpartum day 2 doing well.  Feeding:  breast  Contraception:  natural family planning (NFP)  Discharge hemoglobin:  Hemoglobin  Date Value Range Status  04/23/2012 8.9* 12.0 - 15.0 g/dL Final     HCT  Date Value Range Status  04/23/2012 27.5* 36.0 - 46.0 % Final    Discharge Physical Exam:   General: alert, cooperative and no distress Lochia: appropriate Uterine Fundus: firm Incision: healing well DVT Evaluation: No evidence of DVT seen on physical exam. Negative Homan's sign.  Intrapartum  Procedures: vacuum and GBS prophylaxis Postpartum Procedures: none Complications-Operative and Postpartum: 2nd degree perineal laceration  Discharge Diagnoses: Post-date pregnancy and Anemia  Discharge Information:  Activity:           per [facility] handbook Diet:                routine Medications: Ibuprofen and Iron Condition:      stable Instructions:  refer to practice specific booklet Discharge to: home  Follow-up Information   Follow up with [facility]. Schedule an appointment as soon as possible for a visit in 6 weeks. (Call with any questions or concerns)    Contact information:   [address] [place] [address] [phone]       Haroldine Laws CNM, MSN 04/24/2012

## 2012-04-24 NOTE — Progress Notes (Signed)
UR chart review completed.  

## 2012-11-30 ENCOUNTER — Other Ambulatory Visit: Payer: Self-pay

## 2013-11-26 ENCOUNTER — Encounter (HOSPITAL_COMMUNITY): Payer: Self-pay

## 2014-01-05 IMAGING — US US RENAL
2 series · 14 of 25 positions shown · non-contrast
Comparison: None.

CLINICAL DATA: Left flank pain, microscopic hematuria, 32 weeks
pregnant

RENAL/URINARY TRACT ULTRASOUND COMPLETE

[Series 1: us renal · 12 of 41 slices shown (1 of 2)]
[im 1/41]
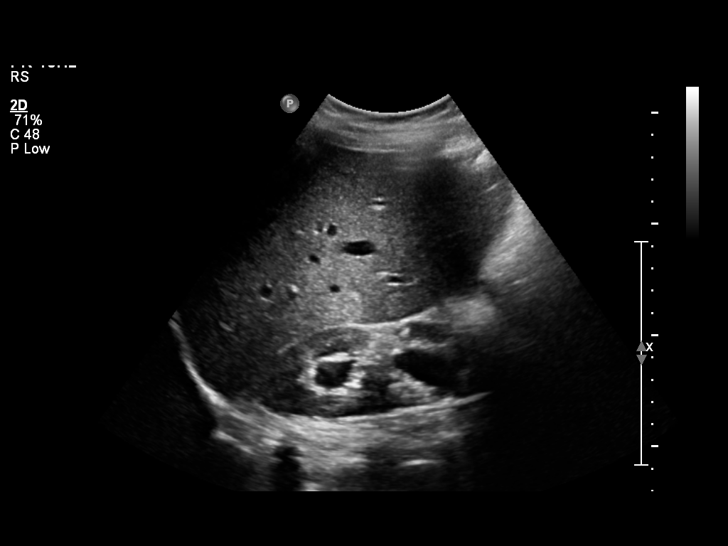
[im 4/41]
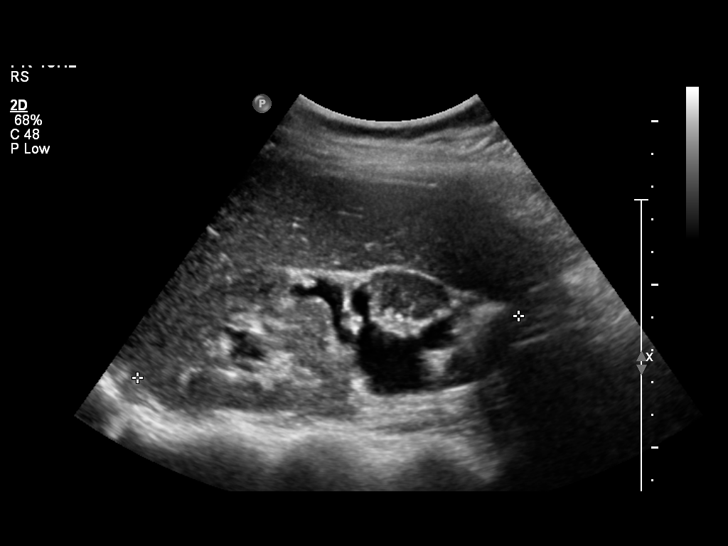
[im 8/41]
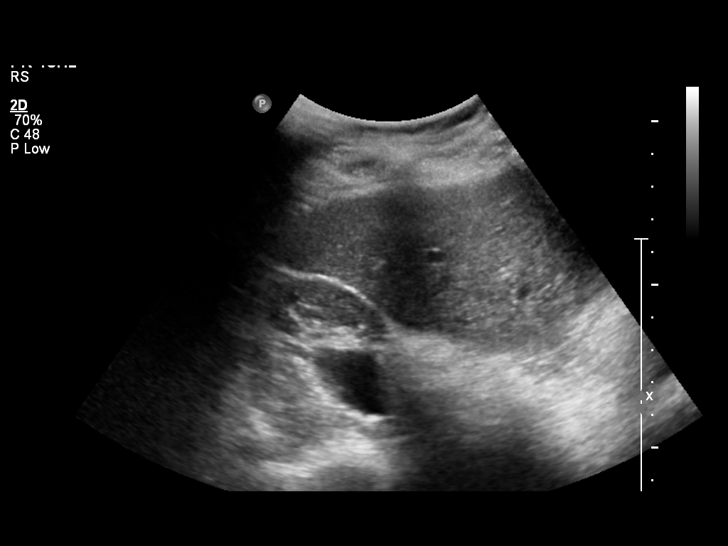
[im 12/41]
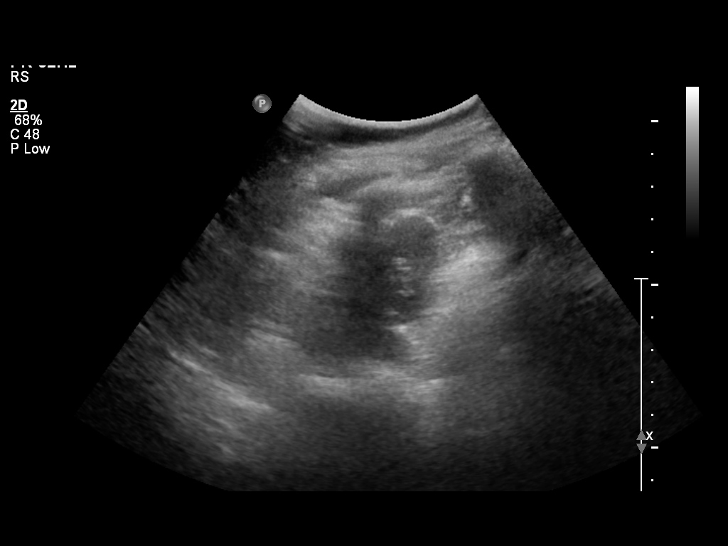
[im 16/41]
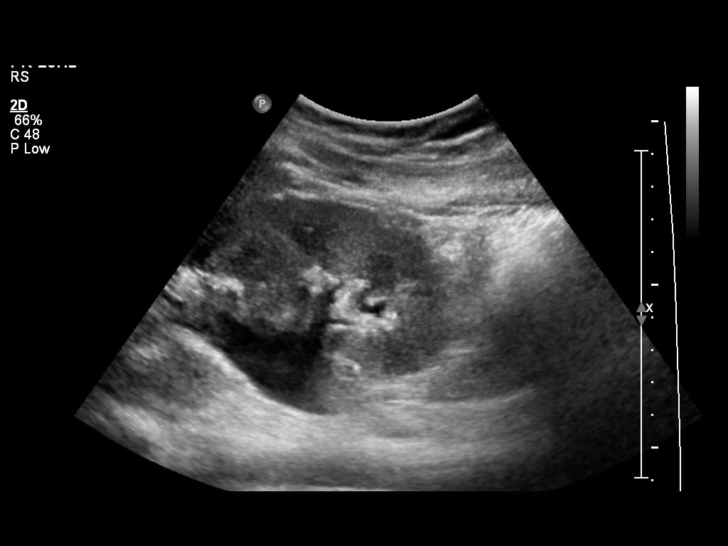
[im 18/41]
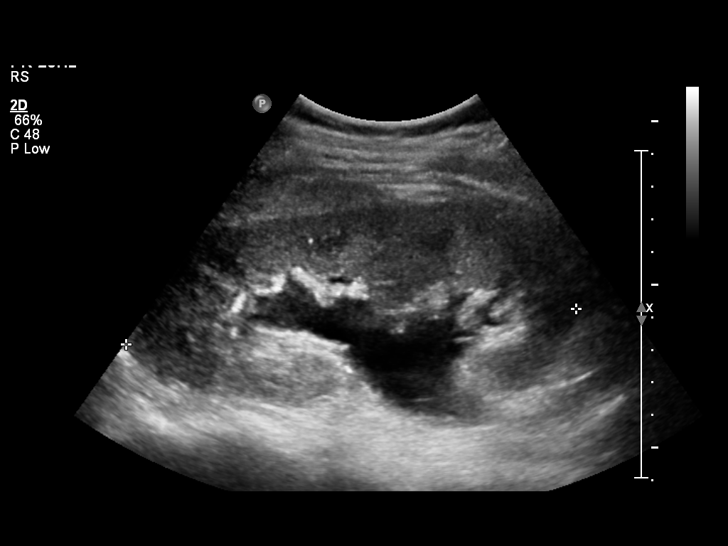
[im 21/41]
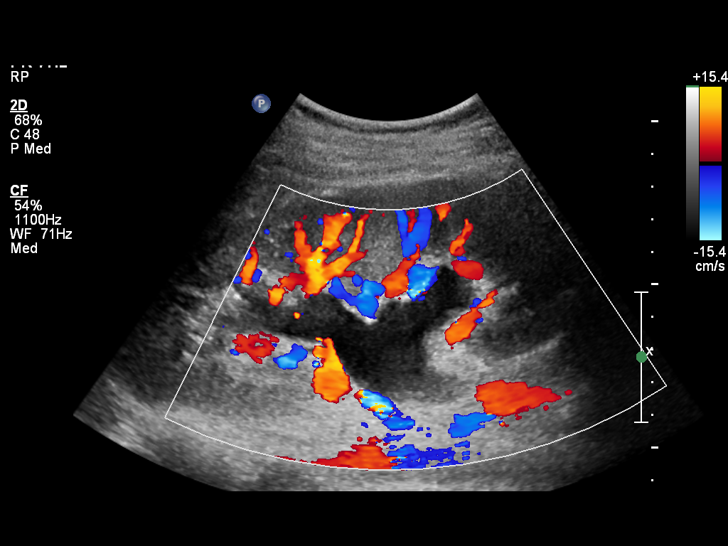
[im 25/41]
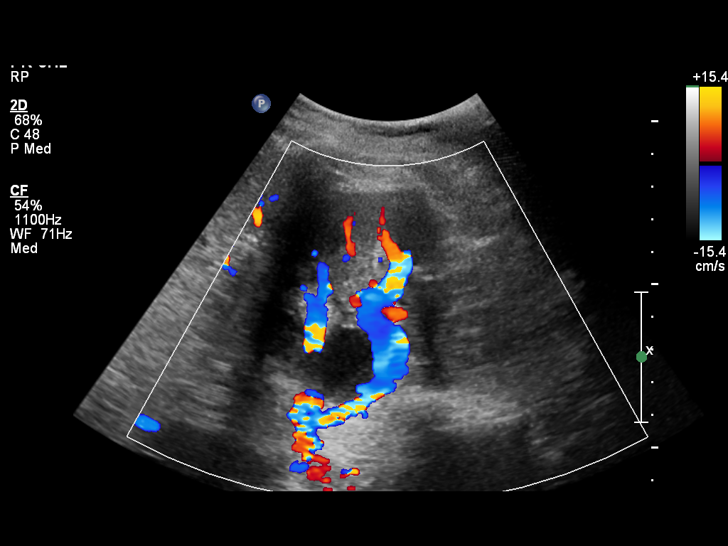
[im 29/41]
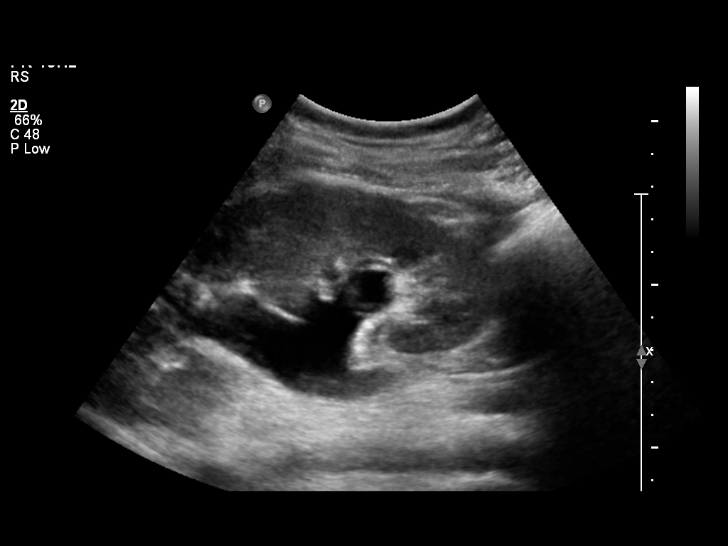
[im 31/41]
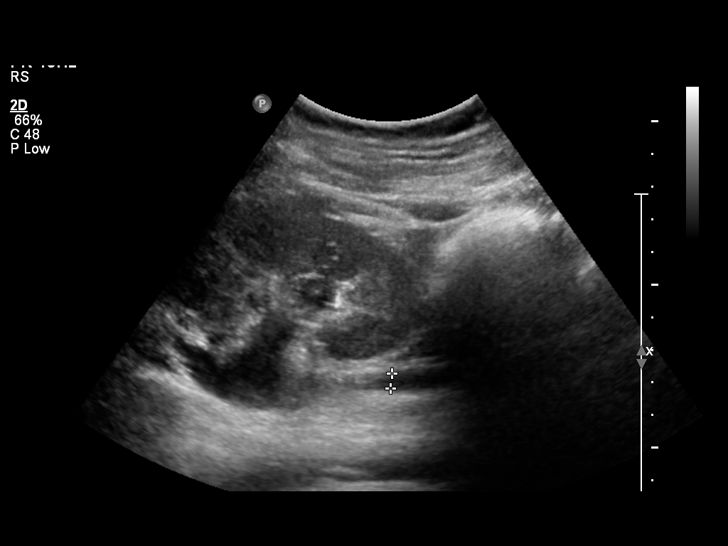
[im 35/41]
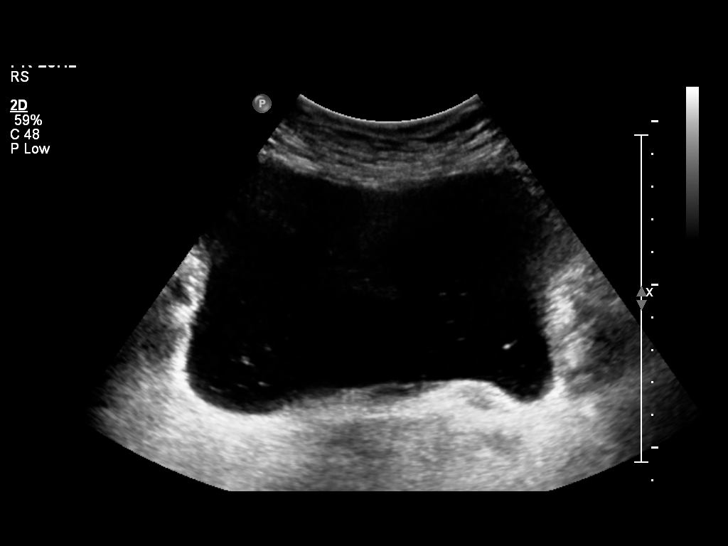
[im 39/41]
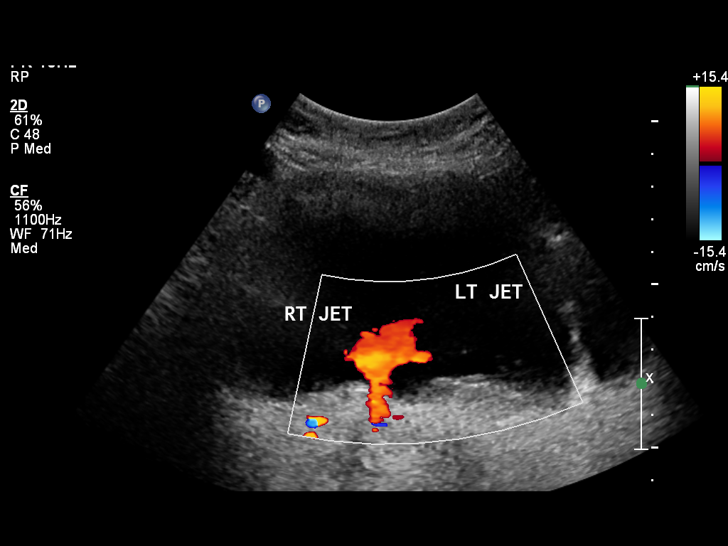

[Series 1: us renal · 2 of 5 slices shown (2 of 2)]
[im 1/5]
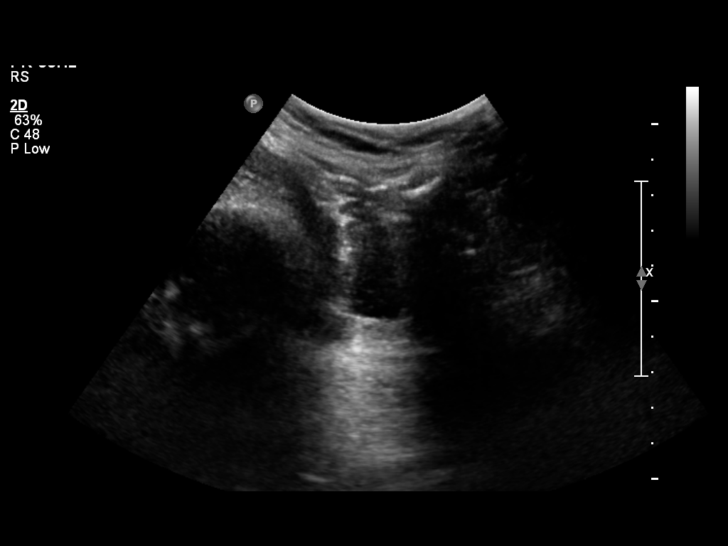
[im 5/5]
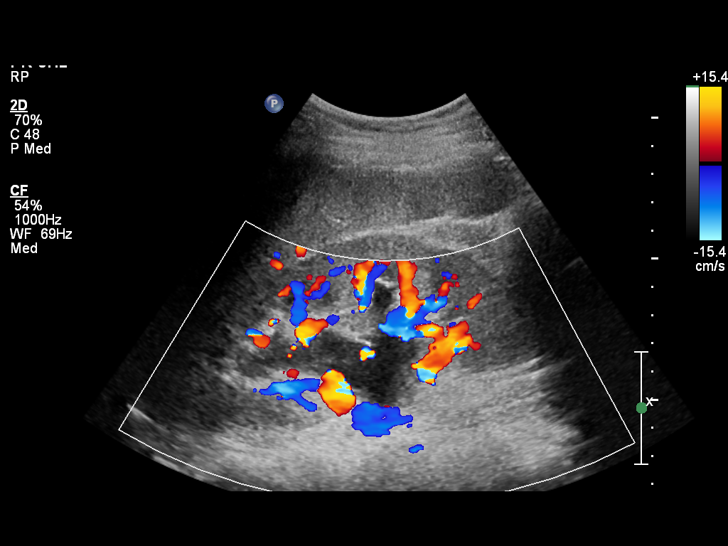

[14 of 25 positions shown; findings below may reference images not displayed]

FINDINGS: Right Kidney:  Measures 11.8 cm.  Mild to moderate
pelvicaliectasis. No mass or calculus is seen.

Left Kidney:  Measures 13.8 cm.  Moderate hydronephrosis.  Proximal
ureter is mildly dilated, measuring 4.5 mm.  No mass or calculus is
seen.

Bladder:  Debris within the bladder.  Bilateral bladder jets are
visualized.
IMPRESSION: Moderate left hydroureteronephrosis.  Mild to moderate right
pelvicaliectasis.

Bilateral bladder jets are visualized.  No calculus is seen.

## 2014-09-21 ENCOUNTER — Emergency Department (HOSPITAL_COMMUNITY)
Admission: EM | Admit: 2014-09-21 | Discharge: 2014-09-21 | Disposition: A | Discharge status: Home / Self Care | Payer: Medicaid Other | Attending: Emergency Medicine | Admitting: Emergency Medicine

## 2014-09-21 DIAGNOSIS — J45909 Unspecified asthma, uncomplicated: Secondary | ICD-10-CM | POA: Diagnosis not present

## 2014-09-21 DIAGNOSIS — L03116 Cellulitis of left lower limb: Secondary | ICD-10-CM | POA: Diagnosis not present

## 2014-09-21 DIAGNOSIS — L02416 Cutaneous abscess of left lower limb: Secondary | ICD-10-CM | POA: Insufficient documentation

## 2014-09-21 DIAGNOSIS — R202 Paresthesia of skin: Secondary | ICD-10-CM | POA: Diagnosis not present

## 2014-09-21 DIAGNOSIS — L02419 Cutaneous abscess of limb, unspecified: Secondary | ICD-10-CM

## 2014-09-21 DIAGNOSIS — L03119 Cellulitis of unspecified part of limb: Secondary | ICD-10-CM

## 2014-09-21 DIAGNOSIS — Z79899 Other long term (current) drug therapy: Secondary | ICD-10-CM | POA: Diagnosis not present

## 2014-09-21 MED ORDER — SULFAMETHOXAZOLE-TRIMETHOPRIM 800-160 MG PO TABS: 1.0000 | ORAL_TABLET | Freq: Two times a day (BID) | ORAL | Status: DC

## 2014-09-21 MED ORDER — CEPHALEXIN 500 MG PO CAPS: ORAL_CAPSULE | ORAL | Status: DC

## 2014-09-21 NOTE — ED Notes (Signed)
Pt complains of a red boil on her left calf for the past week. Pt states the boil has gotten progressively worse over the past week. 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

## 2014-09-21 NOTE — ED Provider Notes (Signed)
CSN: [id number]     Arrival date & time 09/21/14  2155 History   First MD Initiated Contact with Patient 09/21/14 2235     Chief Complaint  Patient presents with  . Abscess     (Consider location/radiation/quality/duration/timing/severity/associated sxs/prior Treatment) HPI Comments: Erin Ayala is a 29 y.o. female with a PMHx of asthma, ADHD, and depression, who presents to the ED with complaints of abscess to the left posterior calf that began gradually 4 days ago. She believes it occurred because of an ingrown hair as a result of her kneepads rubbing against the area. She reports 4/10 constant dull and tingling pain to the abscess and surrounding area on the left posterior calf, nonradiating, worse with walking and palpation in the area, and unrelieved with heat. She noticed white drainage from the area only if she squeezes it. Associated symptoms include erythema, warmth, and swelling. She denies any red streaking, fevers, chills, chest pain, shortness of breath, abdominal pain, nausea, vomiting, diarrhea, dysuria, hematuria, numbness, weakness, immunosuppressive medications or medical conditions, or IV drug use. She has a history of a prior abscess in a similar location.  Patient is a 29 y.o. female presenting with abscess. The history is provided by the patient. No language interpreter was used.  Abscess Location:  Leg Leg abscess location:  L lower leg Abscess quality: draining (only when squeezed), induration, painful, redness and warmth   Abscess quality: no fluctuance   Red streaking: no   Duration:  4 days Progression:  Unchanged Pain details:    Quality:  Dull and tingling   Severity:  Mild   Duration:  4 days   Timing:  Constant   Progression:  Unchanged Chronicity:  New Context: skin injury   Context: not diabetes, not immunosuppression and not injected drug use   Relieved by:  Nothing Exacerbated by: walking and squeezing. Ineffective treatments:  Warm compresses  and draining/squeezing Associated symptoms: no fever, no nausea and no vomiting   Risk factors: prior abscess     Past Medical History  Diagnosis Date  . Asthma high school    Exercised induced  . ADHD (attention deficit hyperactivity disorder)   . Depression   . 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 No past surgeries     Family History  Problem Relation Age of Onset  . Seizures Mother   . Depression Father   . Depression Brother   . Anxiety disorder Sister    Social History  Substance Use Topics  . Smoking status: Never Smoker   . Smokeless tobacco: Never Used  . Alcohol Use: No   OB History    Gravida Para Term Preterm AB TAB SAB Ectopic Multiple Living   1 1 1       1      Review of Systems  Constitutional: Negative for fever and chills.  Respiratory: Negative for shortness of breath.   Cardiovascular: Negative for chest pain.  Gastrointestinal: Negative for nausea, vomiting, abdominal pain and diarrhea.  Genitourinary: Negative for dysuria and hematuria.  Musculoskeletal: Positive for myalgias (L posterior calf where abscess is located). Negative for arthralgias.  Skin: Positive for color change and wound (ingrown hair).  Allergic/Immunologic: Negative for immunocompromised state.  Neurological: Negative for weakness and numbness.       +tingling around abscess  Psychiatric/Behavioral: Negative for confusion.   10 Systems reviewed and are negative for acute change except as noted in  the HPI.    Allergies  Review of patient's allergies indicates no known allergies.  Home Medications   Prior to Admission medications   Medication Sig Start Date End Date Taking? Authorizing Provider  ferrous sulfate (FERROUSUL) 325 (65 FE) MG tablet Take 1 tablet (325 mg total) by mouth daily with breakfast. 04/24/12   Haroldine Laws, CNM  ibuprofen (ADVIL,MOTRIN) 600 MG tablet Take 1  tablet (600 mg total) by mouth every 6 (six) hours. 04/24/12   Haroldine Laws, CNM  pantoprazole (PROTONIX) 40 MG tablet Take 40 mg by mouth daily. 02/24/12   Silverio Lay, MD  Prenatal Vit-Fe Fumarate-FA (PRENATAL MULTIVITAMIN) TABS Take 1 tablet by mouth every morning.    Historical Provider, MD   BP 123/86 mmHg  Pulse 77  Temp(Src) 98.1 F (36.7 C) (Oral)  Resp 18  SpO2 100% Physical Exam  Constitutional: She is oriented to person, place, and time. Vital signs are normal. She appears well-developed and well-nourished.  Non-toxic appearance. No distress.  Afebrile, nontoxic, NAD  HENT:  Head: Normocephalic and atraumatic.  Mouth/Throat: Mucous membranes are normal.  Eyes: Conjunctivae and EOM are normal. Right eye exhibits no discharge. Left eye exhibits no discharge.  Neck: Normal range of motion. Neck supple.  Cardiovascular: Normal rate and intact distal pulses.   Pulmonary/Chest: Effort normal. No respiratory distress.  Abdominal: Normal appearance. She exhibits no distension.  Musculoskeletal: Normal range of motion.  Neurological: She is alert and oriented to person, place, and time. She has normal strength. No sensory deficit.  Skin: Skin is warm, dry and intact. No rash noted. There is erythema.     L posterior calf with small ~1cm indurated abscess with surrounding erythema approx 4cm in diameter, no red streaking, no fluctuance or drainage, mildly warm to the touch.   Psychiatric: She has a normal mood and affect. Her behavior is normal.  Nursing note and vitals reviewed.   ED Course  Procedures (including critical care time) Labs Review Labs Reviewed - No data to display  Imaging Review No results found. I have personally reviewed and evaluated these images and lab results as part of my medical decision-making.   EKG Interpretation None      MDM   Final diagnoses:  Cellulitis and abscess of leg    29 y.o. female here with cellulitis and early abscess to  L posterior calf. NVI with soft compartments. Indurated abscess with pinpoint skin opening, no drainage, erythematous area surrounding this. Abscess not amendable to I&D today but will start abx, line drawn around area and discussed returning if it spreads outside the line, will have her f/up with [facility] in 2 days for recheck to see if has become fluctuant. Discussed tylenol/motrin for pain, and heat to help with infection. I explained the diagnosis and have given explicit precautions to return to the ER including for any other new or worsening symptoms. The patient understands and accepts the medical plan as it's been dictated and I have answered their questions. Discharge instructions concerning home care and prescriptions have been given. The patient is STABLE and is discharged to home in good condition.  BP 123/86 mmHg  Pulse 77  Temp(Src) 98.1 F (36.7 C) (Oral)  Resp 18  SpO2 100%  Meds ordered this encounter  Medications  . sulfamethoxazole-trimethoprim (BACTRIM DS,SEPTRA DS) 800-160 MG per tablet    Sig: Take 1 tablet by mouth 2 (two) times daily.    Dispense:  14 tablet    Refill:  0  Order Specific Question:  Supervising Provider    Answer:  Eber Hong [[id number]]  . cephALEXin (KEFLEX) 500 MG capsule    Sig: 2 caps po bid x 7 days    Dispense:  28 capsule    Refill:  0    Order Specific Question:  Supervising Provider    Answer:  Eber Hong [[id number]]      Sharday Michl Camprubi-Soms, PA-C 09/21/14 2258  Eber Hong, MD 09/22/14 [phone]

## 2014-09-21 NOTE — Discharge Instructions (Signed)
Keep wound clean and dry. Apply warm compresses to affected area throughout the day. Take antibiotics until it is finished. Take tylenol and motrin as needed for pain. Followup with [facility]Primary Care doctor in 2 days for wound recheck. Monitor area for signs of infection to include, but not limited to: increasing pain, spreading redness, worsening drainage/pus, or increasing swelling. Return to emergency department for emergent changing or worsening symptoms.    Abscess An abscess (boil or furuncle) is an infected area on or under the skin. This area is filled with yellowish-white fluid (pus) and other material (debris). HOME CARE   Only take medicines as told by your doctor.  If you were given antibiotic medicine, take it as directed. Finish the medicine even if you start to feel better.  If gauze is used, follow your doctor's directions for changing the gauze.  To avoid spreading the infection:  Keep your abscess covered with a bandage.  Wash your hands well.  Do not share personal care items, towels, or whirlpools with others.  Avoid skin contact with others.  Keep your skin and clothes clean around the abscess.  Keep all doctor visits as told. GET HELP RIGHT AWAY IF:   You have more pain, puffiness (swelling), or redness in the wound site.  You have more fluid or blood coming from the wound site.  You have muscle aches, chills, or you feel sick.  You have a fever. MAKE SURE YOU:   Understand these instructions.  Will watch your condition.  Will get help right away if you are not doing well or get worse. Document Released: 06/30/2007 Document Revised: 07/13/2011 Document Reviewed: 03/26/2011 [facility] Patient Information 2015 [place], [place]. This information is not intended to replace advice given to you by your health care provider. Make sure you discuss any questions you have with your health care provider.  Cellulitis Cellulitis is an infection of  the skin and the tissue under the skin. The infected area is usually red and tender. This happens most often in the arms and lower legs. HOME CARE   Take your antibiotic medicine as told. Finish the medicine even if you start to feel better.  Keep the infected arm or leg raised (elevated).  Put a warm cloth on the area up to 4 times per day.  Only take medicines as told by your doctor.  Keep all doctor visits as told. GET HELP IF:  You see red streaks on the skin coming from the infected area.  Your red area gets bigger or turns a dark color.  Your bone or joint under the infected area is painful after the skin heals.  Your infection comes back in the same area or different area.  You have a puffy (swollen) bump in the infected area.  You have new symptoms.  You have a fever. GET HELP RIGHT AWAY IF:   You feel very sleepy.  You throw up (vomit) or have watery poop (diarrhea).  You feel sick and have muscle aches and pains. MAKE SURE YOU:   Understand these instructions.  Will watch your condition.  Will get help right away if you are not doing well or get worse. Document Released: 06/30/2007 Document Revised: 05/28/2013 Document Reviewed: 03/29/2011 [facility] Patient Information 2015 [place], [place]. This information is not intended to replace advice given to you by your health care provider. Make sure you discuss any questions you have with your health care provider.

## 2014-09-24 ENCOUNTER — Encounter (HOSPITAL_COMMUNITY): Payer: Self-pay | Admitting: Emergency Medicine

## 2014-09-24 ENCOUNTER — Emergency Department (HOSPITAL_COMMUNITY)
Admission: EM | Admit: 2014-09-24 | Discharge: 2014-09-24 | Disposition: A | Discharge status: Home / Self Care | Payer: Medicaid Other | Source: Home / Self Care | Attending: Emergency Medicine | Admitting: Emergency Medicine

## 2014-09-24 DIAGNOSIS — L02419 Cutaneous abscess of limb, unspecified: Secondary | ICD-10-CM

## 2014-09-24 DIAGNOSIS — L03116 Cellulitis of left lower limb: Secondary | ICD-10-CM

## 2014-09-24 MED ORDER — HYDROCODONE-ACETAMINOPHEN 5-325 MG PO TABS: 1.0000 | ORAL_TABLET | ORAL | Status: DC | PRN

## 2014-09-24 NOTE — ED Provider Notes (Signed)
CSN: [id number]     Arrival date & time 09/24/14  1507 History   First MD Initiated Contact with Patient 09/24/14 1539     Chief Complaint  Patient presents with  . Abscess  . Wound Infection   (Consider location/radiation/quality/duration/timing/severity/associated sxs/prior Treatment) HPI Comments: 29 year old female seen in the emergency department 3 days ago for cellulitis and possibly early abscess to the left calf. According to the documentation it was not amenable to I&D and she was treated with Septra and Keflex. She did not refill her prescriptions until later and she has only had 2 doses. She presents today with a mounded area of induration, erythema and a central open lesion draining dark purulence. The open lesion is circular and approximately 2 cm in diameter. There is a scan marker line and circled the area of erythema on the day of the ER visit. The erythema has receded from this line however the abscess continued to develop. The induration is approximately 8 centimeters in diameter. No lymphangitis.   Past Medical History  Diagnosis Date  . Asthma high school    Exercised induced  . ADHD (attention deficit hyperactivity disorder)   . Depression   . 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 No past surgeries     Family History  Problem Relation Age of Onset  . Seizures Mother   . Depression Father   . Depression Brother   . Anxiety disorder Sister    Social History  Substance Use Topics  . Smoking status: Never Smoker   . Smokeless tobacco: Never Used  . Alcohol Use: Yes     Comment: occasional   OB History    Gravida Para Term Preterm AB TAB SAB Ectopic Multiple Living   1 1 1       1      Review of Systems  Constitutional: Positive for activity change.  Skin: Positive for color change and wound.  All other systems reviewed and are negative.   Allergies  Review of  patient's allergies indicates no known allergies.  Home Medications   Prior to Admission medications   Medication Sig Start Date End Date Taking? Authorizing Provider  cephALEXin (KEFLEX) 500 MG capsule 2 caps po bid x 7 days 09/21/14  Yes Mercedes Camprubi-Soms, PA-C  sulfamethoxazole-trimethoprim (BACTRIM DS,SEPTRA DS) 800-160 MG per tablet Take 1 tablet by mouth 2 (two) times daily. 09/21/14  Yes Mercedes Camprubi-Soms, PA-C  ferrous sulfate (FERROUSUL) 325 (65 FE) MG tablet Take 1 tablet (325 mg total) by mouth daily with breakfast. 04/24/12   Haroldine Laws, CNM  HYDROcodone-acetaminophen (NORCO/VICODIN) 5-325 MG per tablet Take 1 tablet by mouth every 4 (four) hours as needed. 09/24/14   Hayden Rasmussen, NP  ibuprofen (ADVIL,MOTRIN) 600 MG tablet Take 1 tablet (600 mg total) by mouth every 6 (six) hours. 04/24/12   Haroldine Laws, CNM  pantoprazole (PROTONIX) 40 MG tablet Take 40 mg by mouth daily. 02/24/12   Silverio Lay, MD  Prenatal Vit-Fe Fumarate-FA (PRENATAL MULTIVITAMIN) TABS Take 1 tablet by mouth every morning.    Historical Provider, MD   Meds Ordered and Administered this Visit  Medications - No data to display  BP 100/65 mmHg  Pulse 76  Temp(Src) 97.4 F (36.3 C) (Oral)  Resp 12  SpO2 100%  LMP  (LMP Unknown) No data found.   Physical Exam  Constitutional: She is oriented to person, place, and time. She appears well-developed and  well-nourished. No distress.  Eyes: EOM are normal.  Neck: Normal range of motion. Neck supple.  Cardiovascular: Normal rate.   Pulmonary/Chest: Effort normal. No respiratory distress.  Musculoskeletal: She exhibits no edema.  Neurological: She is alert and oriented to person, place, and time. She exhibits normal muscle tone.  Skin: Skin is warm and dry.  See history of present illness for description of abscess.  Psychiatric: She has a normal mood and affect.  Nursing note and vitals reviewed.   ED Course  INCISION AND DRAINAGE Date/Time:  09/24/2014 5:47 PM Performed by: Phineas Real, Lester Crickenberger Authorized by: Domenick Gong Consent: Verbal consent obtained. Risks and benefits: risks, benefits and alternatives were discussed Consent given by: patient Patient understanding: patient states understanding of the procedure being performed Patient identity confirmed: verbally with patient Type: abscess Body area: lower extremity Location details: left leg Anesthesia: local infiltration Local anesthetic: lidocaine 1% with epinephrine Anesthetic total: 10 ml Patient sedated: no Scalpel size: 11 Incision type: single with marsupialization Incision depth: subcutaneous Complexity: complex Drainage: purulent Drainage amount: moderate Wound treatment: drain placed Packing material: 1/4 in iodoform gauze Patient tolerance: Patient tolerated the procedure well with no immediate complications   (including critical care time)  Labs Review Labs Reviewed - No data to display  Imaging Review No results found.   Visual Acuity Review  Right Eye Distance:   Left Eye Distance:   Bilateral Distance:    Right Eye Near:   Left Eye Near:    Bilateral Near:         MDM   1. Abscess of calf   2. Cellulitis of left lower extremity   I and D Cont your ABX rto 2 d wound chk and pack removal Keep dry and clean norco  #12    Hayden Rasmussen, NP 09/24/14 [id number]

## 2014-09-24 NOTE — ED Notes (Signed)
Packing strip was taped to pt's leg, wound covered in gauze and leg wrapped in Kerlex.

## 2014-09-24 NOTE — Discharge Instructions (Signed)
Abscess °An abscess is an infected area that contains a collection of pus and debris. It can occur in almost any part of the body. An abscess is also known as a furuncle or boil. °CAUSES  °An abscess occurs when tissue gets infected. This can occur from blockage of oil or sweat glands, infection of hair follicles, or a minor injury to the skin. As the body tries to fight the infection, pus collects in the area and creates pressure under the skin. This pressure causes pain. People with weakened immune systems have difficulty fighting infections and get certain abscesses more often.  °SYMPTOMS °Usually an abscess develops on the skin and becomes a painful mass that is red, warm, and tender. If the abscess forms under the skin, you may feel a moveable soft area under the skin. Some abscesses break open (rupture) on their own, but most will continue to get worse without care. The infection can spread deeper into the body and eventually into the bloodstream, causing you to feel ill.  °DIAGNOSIS  °Your caregiver will take your medical history and perform a physical exam. A sample of fluid may also be taken from the abscess to determine what is causing your infection. °TREATMENT  °Your caregiver may prescribe antibiotic medicines to fight the infection. However, taking antibiotics alone usually does not cure an abscess. Your caregiver may need to make a small cut (incision) in the abscess to drain the pus. In some cases, gauze is packed into the abscess to reduce pain and to continue draining the area. °HOME CARE INSTRUCTIONS  °· Only take over-the-counter or prescription medicines for pain, discomfort, or fever as directed by your caregiver. °· If you were prescribed antibiotics, take them as directed. Finish them even if you start to feel better. °· If gauze is used, follow your caregiver's directions for changing the gauze. °· To avoid spreading the infection: °· Keep your draining abscess covered with a  bandage. °· Wash your hands well. °· Do not share personal care items, towels, or whirlpools with others. °· Avoid skin contact with others. °· Keep your skin and clothes clean around the abscess. °· Keep all follow-up appointments as directed by your caregiver. °SEEK MEDICAL CARE IF:  °· You have increased pain, swelling, redness, fluid drainage, or bleeding. °· You have muscle aches, chills, or a general ill feeling. °· You have a fever. °MAKE SURE YOU:  °· Understand these instructions. °· Will watch your condition. °· Will get help right away if you are not doing well or get worse. °Document Released: 10/21/2004 Document Revised: 07/13/2011 Document Reviewed: 03/26/2011 °[employer]® Patient Information ©2015 [employer]. This information is not intended to replace advice given to you by your health care provider. Make sure you discuss any questions you have with your health care provider. ° °Abscess °Care After °An abscess (also called a boil or furuncle) is an infected area that contains a collection of pus. Signs and symptoms of an abscess include pain, tenderness, redness, or hardness, or you may feel a moveable soft area under your skin. An abscess can occur anywhere in the body. The infection may spread to surrounding tissues causing cellulitis. A cut (incision) by the surgeon was made over your abscess and the pus was drained out. Gauze may have been packed into the space to provide a drain that will allow the cavity to heal from the inside outwards. The boil may be painful for 5 to 7 days. Most people with a boil do not have   high fevers. Your abscess, if seen early, may not have localized, and may not have been lanced. If not, another appointment may be required for this if it does not get better on its own or with medications. HOME CARE INSTRUCTIONS   Only take over-the-counter or prescription medicines for pain, discomfort, or fever as directed by your caregiver.  When you bathe, soak and then  remove gauze or iodoform packs at least daily or as directed by your caregiver. You may then wash the wound gently with mild soapy water. Repack with gauze or do as your caregiver directs. SEEK IMMEDIATE MEDICAL CARE IF:   You develop increased pain, swelling, redness, drainage, or bleeding in the wound site.  You develop signs of generalized infection including muscle aches, chills, fever, or a general ill feeling.  An oral temperature above 102 F (38.9 C) develops, not controlled by medication. See your caregiver for a recheck if you develop any of the symptoms described above. If medications (antibiotics) were prescribed, take them as directed. Document Released: 07/30/2004 Document Revised: 04/05/2011 Document Reviewed: 03/27/2007 [facility] Patient Information 2015 [place], [place]. This information is not intended to replace advice given to you by your health care provider. Make sure you discuss any questions you have with your health care provider.  Incision and Drainage Incision and drainage is a procedure in which a sac-like structure (cystic structure) is opened and drained. The area to be drained usually contains material such as pus, fluid, or blood.  LET YOUR CAREGIVER KNOW ABOUT:   Allergies to medicine.  Medicines taken, including vitamins, herbs, eyedrops, over-the-counter medicines, and creams.  Use of steroids (by mouth or creams).  Previous problems with anesthetics or numbing medicines.  History of bleeding problems or blood clots.  Previous surgery.  Other health problems, including diabetes and kidney problems.  Possibility of pregnancy, if this applies. RISKS AND COMPLICATIONS  Pain.  Bleeding.  Scarring.  Infection. BEFORE THE PROCEDURE  You may need to have an ultrasound or other imaging tests to see how large or deep your cystic structure is. Blood tests may also be used to determine if you have an infection or how severe the infection is. You may  need to have a tetanus shot. PROCEDURE  The affected area is cleaned with a cleaning fluid. The cyst area will then be numbed with a medicine (local anesthetic). A small incision will be made in the cystic structure. A syringe or catheter may be used to drain the contents of the cystic structure, or the contents may be squeezed out. The area will then be flushed with a cleansing solution. After cleansing the area, it is often gently packed with a gauze or another wound dressing. Once it is packed, it will be covered with gauze and tape or some other type of wound dressing. AFTER THE PROCEDURE   Often, you will be allowed to go home right after the procedure.  You may be given antibiotic medicine to prevent or heal an infection.  If the area was packed with gauze or some other wound dressing, you will likely need to come back in 1 to 2 days to get it removed.  The area should heal in about 14 days. Document Released: 07/07/2000 Document Revised: 07/13/2011 Document Reviewed: 03/08/2011 [facility] Patient Information 2015 [place], [place]. This information is not intended to replace advice given to you by your health care provider. Make sure you discuss any questions you have with your health care provider.  MRSA Infection MRSA  stands for methicillin-resistant Staphylococcus aureus. This type of infection is caused by Staphylococcus aureus bacteria that are no longer affected by the medicines used to kill them (drug resistant). Staphylococcus (staph) bacteria are normally found on the skin or in the nose of healthy people. In most cases, these bacteria do not cause infection. But if these resistant bacteria enter your body through a cut or sore, they can cause a serious infection on your skin or in other parts of your body. There is a slight chance that the staph on your skin or in your nose is MRSA. There are two types of MRSA infections:  Hospital-acquired MRSA is bacteria that you get in the  hospital.  Community-acquired MRSA is bacteria that you get somewhere other than in a hospital. RISK FACTORS Hospital-acquired MRSA is more common. You could be at risk for this infection if you are in the hospital and you:  Have surgery or a procedure.  Have an IV access or a catheter tube placed in your body.  Have weak resistance to germs (weakened immune system).  Are elderly.  Are on kidney dialysis. You could be at risk for community-acquired MRSA if you have a break in your skin and come into contact with MRSA. This may happen if you:  Play sports where there is skin-to-skin contact.  Live in a crowded setting, like a dormitory or a [employer].  Share towels, razors, or sports equipment with other people. SYMPTOMS  Symptoms of hospital-acquired MRSA depend on where MRSA has spread. Symptoms may include:  Wound infection.  Skin infection.  Rash.  Pneumonia.  Fever and chills.  Difficulty breathing.  Chest pain. Community-acquired MRSA is most likely to start as a scratch or cut that becomes infected. Symptoms may include:  A pus-filled pimple.  A boil on your skin.  Pus draining from your skin.  A sore (abscess) under your skin or somewhere in your body.  Fever with or without chills. DIAGNOSIS  The diagnosis of MRSA is made by taking a sample from an infected area and sending it to a lab for testing. A lab technician can grow (culture) MRSA and check it under a microscope. The cultured MRSA can be tested to see which type of antibiotic medicine will work to treat it. Newer tests can identify MRSA more quickly by testing bacteria samples for MRSA genes. Your health care provider can diagnose MRSA using samples from:   Cuts or wounds in infected areas.  Nasal swabs.  Saliva or cough specimens from deep in the lungs (sputum).  Urine.  Blood. You may also have:  Imaging studies (such as X-ray or MRI) to check if the infection has spread to the  lungs, bones, or joints.  A culture and sensitivity test of blood or fluids from inside the joints. TREATMENT  Treatment depends on how severe, deep, or extensive the infection is. Very bad infections may require a hospital stay.  Some skin infections, such as a small boil or sore (abscess), may be treated by draining pus from the site of the infection.  More extensive surgery to drain pus may be necessary for deeper or more widespread soft tissue infections.  You may then have to take antibiotic medicine given by mouth or through a vein. You may start antibiotic treatment right away or after testing can be done to see what antibiotic medicine should be used. HOME CARE INSTRUCTIONS   Take your antibiotics as directed by your health care provider. Take the medicine  as prescribed until it is finished.  Avoid close contact with those around you as much as possible. Do not use towels, razors, toothbrushes, bedding, or other items that will be used by others.  Wash your hands frequently for 15 seconds with soap and water. Dry your hands with a clean or disposable towel.  When you are not able to wash your hands, use hand sanitizer that is more than 60 percent alcohol.  Wash towels, sheets, or clothes in the washing machine with detergent and hot water. Dry them in a hot dryer.  Follow your health care provider's instructions for wound care. Wash your hands before and after changing your bandages.  Always shower after exercising.  Keep all cuts and scrapes clean and covered with a bandage.  Be sure to tell all your health care providers that you have MRSA so they are aware of your infection. SEEK MEDICAL CARE IF:  You have a cut, scrape, pimple, or boil that becomes red, swollen, or painful or has pus in it.  You have pus draining from your skin.  You have an abscess under your skin or somewhere in your body. SEEK IMMEDIATE MEDICAL CARE IF:   You have symptoms of a skin infection  with a fever or chills.  You have trouble breathing.  You have chest pain.  You have a skin wound and you become nauseous or start vomiting. MAKE SURE YOU:  Understand these instructions.  Will watch your condition.  Will get help right away if you are not doing well or get worse. Document Released: 01/11/2005 Document Revised: 01/16/2013 Document Reviewed: 11/03/2012 [facility] Patient Information 2015 [place], [place]. This information is not intended to replace advice given to you by your health care provider. Make sure you discuss any questions you have with your health care provider.

## 2014-09-24 NOTE — ED Notes (Signed)
Pt was treated on Saturday for an abscess on her left calf.  She only started taking the antibiotics, Cephalexin and Bactrim, on Monday.  The calf is red, swollen and very warm to the touch.  Pt denies any fevers at this time.

## 2014-09-26 ENCOUNTER — Emergency Department (HOSPITAL_COMMUNITY)
Admission: EM | Admit: 2014-09-26 | Discharge: 2014-09-26 | Disposition: A | Discharge status: Home / Self Care | Payer: Medicaid Other | Source: Home / Self Care | Attending: Family Medicine | Admitting: Family Medicine

## 2014-09-26 ENCOUNTER — Encounter (HOSPITAL_COMMUNITY): Payer: Self-pay | Admitting: Emergency Medicine

## 2014-09-26 DIAGNOSIS — Z5189 Encounter for other specified aftercare: Secondary | ICD-10-CM

## 2014-09-26 DIAGNOSIS — Z4801 Encounter for change or removal of surgical wound dressing: Secondary | ICD-10-CM

## 2014-09-26 NOTE — Discharge Instructions (Signed)
°  Your wound was repacked with a short packing strip today. In 2 days you may hold the packing strip out of the wound. He may shower and allow the wound to be irrigated. Make sure after getting it wet that you attempt to drain the water from the wound and compress it with 4 x 4's to wake the water out. His long as the wound is opened keep covered to catch any drainage. Only a light dressing or large Band-Aid may be needed. For signs of infection, worsening or other problems may return. Recommend using warm compresses often.

## 2014-09-26 NOTE — ED Provider Notes (Signed)
CSN: [id number]     Arrival date & time 09/26/14  1301 History   First MD Initiated Contact with Patient 09/26/14 1315     Chief Complaint  Patient presents with  . Wound Check   (Consider location/radiation/quality/duration/timing/severity/associated sxs/prior Treatment) HPI Comments: 29 year old female returns to the urgent care for wound check and packing removal. She was seen by this examiner 2 days ago for I&D of an abscess to her left posterior calf. She states she is feeling well. She is continuing to take her antibodies. She is also noticed that the erythema has decreased in area and there is no additional drainage. No constitutional symptoms. No fever, chills. There is no lymphangitis or extension of erythema. Patient is a 29 y.o. female presenting with wound check. The history is provided by the patient. No language interpreter was used.  Wound Check This is a new problem. The current episode started more than 1 week ago. The problem occurs constantly. The problem has been rapidly improving. Nothing aggravates the symptoms. She has tried a warm compress and rest for the symptoms. The treatment provided significant relief.    Past Medical History  Diagnosis Date  . Asthma high school    Exercised induced  . ADHD (attention deficit hyperactivity disorder)   . Depression   . 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 No past surgeries     Family History  Problem Relation Age of Onset  . Seizures Mother   . Depression Father   . Depression Brother   . Anxiety disorder Sister    Social History  Substance Use Topics  . Smoking status: Never Smoker   . Smokeless tobacco: Never Used  . Alcohol Use: Yes     Comment: occasional   OB History    Gravida Para Term Preterm AB TAB SAB Ectopic Multiple Living   1 1 1       1      Review of Systems  Constitutional: Positive for activity change.  Negative for fever.  Respiratory: Negative.   Cardiovascular: Negative for leg swelling.  Musculoskeletal: Negative for myalgias and gait problem.  Skin: Positive for wound.  Neurological: Negative.   All other systems reviewed and are negative.   Allergies  Review of patient's allergies indicates no known allergies.  Home Medications   Prior to Admission medications   Medication Sig Start Date End Date Taking? Authorizing Provider  cephALEXin (KEFLEX) 500 MG capsule 2 caps po bid x 7 days 09/21/14  Yes Mercedes Camprubi-Soms, PA-C  ferrous sulfate (FERROUSUL) 325 (65 FE) MG tablet Take 1 tablet (325 mg total) by mouth daily with breakfast. 04/24/12  Yes Haroldine Laws, CNM  sulfamethoxazole-trimethoprim (BACTRIM DS,SEPTRA DS) 800-160 MG per tablet Take 1 tablet by mouth 2 (two) times daily. 09/21/14  Yes Mercedes Camprubi-Soms, PA-C  HYDROcodone-acetaminophen (NORCO/VICODIN) 5-325 MG per tablet Take 1 tablet by mouth every 4 (four) hours as needed. 09/24/14   Hayden Rasmussen, NP  ibuprofen (ADVIL,MOTRIN) 600 MG tablet Take 1 tablet (600 mg total) by mouth every 6 (six) hours. 04/24/12   Haroldine Laws, CNM  pantoprazole (PROTONIX) 40 MG tablet Take 40 mg by mouth daily. 02/24/12   Silverio Lay, MD  Prenatal Vit-Fe Fumarate-FA (PRENATAL MULTIVITAMIN) TABS Take 1 tablet by mouth every morning.    Historical Provider, MD   Meds Ordered and Administered this Visit  Medications - No data to display  BP 99/66 mmHg  Pulse 64  Temp(Src) 98.6 F (37 C) (Oral)  SpO2 100%  LMP  (LMP Unknown) No data found.   Physical Exam  Constitutional: She is oriented to person, place, and time. She appears well-developed and well-nourished. No distress.  Cardiovascular: Normal rate.   Pulmonary/Chest: Effort normal. No respiratory distress.  Neurological: She is alert and oriented to person, place, and time. She exhibits normal muscle tone.  Skin: Skin is warm and dry.  There is decreased swelling and  erythema of the calf wound. There is no current drainage. The bandage was intact. The packing  remained in place.  Psychiatric: She has a normal mood and affect.  Nursing note and vitals reviewed.   ED Course  Wound packing Date/Time: 09/26/2014 1:41 PM Performed by: Phineas Real, Biddie Sebek Authorized by: Bradd Canary D Consent: Verbal consent obtained. Risks and benefits: risks, benefits and alternatives were discussed Consent given by: patient Patient understanding: patient states understanding of the procedure being performed Patient identity confirmed: verbally with patient Preparation: Patient was prepped and draped in the usual sterile fashion. Local anesthesia used: no Patient sedated: no Patient tolerance: Patient tolerated the procedure well with no immediate complications Comments: The old packing was removed. The wound was irrigated with 20 cc of normal saline. There was no blood or purulence visible in the irrigant. Due to the large cavernous wound wound packing, 1/2 inch was used to keep the wound open. Approximately 4 inches of packing was used. A dressing was reapplied.   (including critical care time)  Labs Review Labs Reviewed - No data to display  Imaging Review No results found.   Visual Acuity Review  Right Eye Distance:   Left Eye Distance:   Bilateral Distance:    Right Eye Near:   Left Eye Near:    Bilateral Near:         MDM   1. Wound check, abscess    The packing was removed as was a little dressing. Wound was then irrigated with 20 cc of normal saline. 1/2 inch packing proximally 4 inches in length was reinserted and a new dressing applied. Patient is instructed to remove the packing and 2 days and may shower and keep the wound clean and dry. For any new symptoms also worsening may return.   Hayden Rasmussen, NP 09/26/14 1348  Hayden Rasmussen, NP 09/26/14 1350

## 2014-09-26 NOTE — ED Notes (Signed)
Pt here for a two day follow up on her left calf abscess.  The swelling and redness has gone down and the pt states she has been taking her abx as prescribed.  She denies any issues.

## 2014-09-27 LAB — CULTURE, ROUTINE-ABSCESS

## 2014-10-08 NOTE — ED Notes (Signed)
Treatment of abscess of leg adequate w 2 Rx provided

## 2014-11-14 ENCOUNTER — Encounter (HOSPITAL_COMMUNITY): Payer: Self-pay | Admitting: Emergency Medicine

## 2014-11-14 ENCOUNTER — Emergency Department (HOSPITAL_COMMUNITY)
Admission: EM | Admit: 2014-11-14 | Discharge: 2014-11-14 | Disposition: A | Discharge status: Home / Self Care | Payer: Medicaid Other | Attending: Emergency Medicine | Admitting: Emergency Medicine

## 2014-11-14 DIAGNOSIS — F909 Attention-deficit hyperactivity disorder, unspecified type: Secondary | ICD-10-CM | POA: Diagnosis not present

## 2014-11-14 DIAGNOSIS — K920 Hematemesis: Secondary | ICD-10-CM | POA: Insufficient documentation

## 2014-11-14 DIAGNOSIS — Z79899 Other long term (current) drug therapy: Secondary | ICD-10-CM | POA: Diagnosis not present

## 2014-11-14 DIAGNOSIS — F329 Major depressive disorder, single episode, unspecified: Secondary | ICD-10-CM | POA: Diagnosis not present

## 2014-11-14 DIAGNOSIS — Z792 Long term (current) use of antibiotics: Secondary | ICD-10-CM | POA: Diagnosis not present

## 2014-11-14 DIAGNOSIS — J45909 Unspecified asthma, uncomplicated: Secondary | ICD-10-CM | POA: Insufficient documentation

## 2014-11-14 LAB — COMPREHENSIVE METABOLIC PANEL
ALBUMIN: 4.6 g/dL (ref 3.5–5.0)
ALK PHOS: 60 U/L (ref 38–126)
ALT: 18 U/L (ref 14–54)
AST: 20 U/L (ref 15–41)
Anion gap: 6 (ref 5–15)
BILIRUBIN TOTAL: 2.4 mg/dL — AB (ref 0.3–1.2)
BUN: 10 mg/dL (ref 6–20)
CALCIUM: 9.1 mg/dL (ref 8.9–10.3)
CO2: 28 mmol/L (ref 22–32)
CREATININE: 0.64 mg/dL (ref 0.44–1.00)
Chloride: 105 mmol/L (ref 101–111)
GFR calc Af Amer: >60 mL/min (ref >60)
GFR calc non Af Amer: >60 mL/min (ref >60)
GLUCOSE: 98 mg/dL (ref 65–99)
Potassium: 4 mmol/L (ref 3.5–5.1)
Sodium: 139 mmol/L (ref 135–145)
TOTAL PROTEIN: 7.9 g/dL (ref 6.5–8.1)

## 2014-11-14 LAB — CBC
HCT: 40.7 % (ref 36.0–46.0)
Hemoglobin: 13.6 g/dL (ref 12.0–15.0)
MCH: 29.8 pg (ref 26.0–34.0)
MCHC: 33.4 g/dL (ref 30.0–36.0)
MCV: 89.1 fL (ref 78.0–100.0)
PLATELETS: 185 10*3/uL (ref 150–400)
RBC: 4.57 MIL/uL (ref 3.87–5.11)
RDW: 11.8 % (ref 11.5–15.5)
WBC: 7.4 10*3/uL (ref 4.0–10.5)

## 2014-11-14 LAB — I-STAT BETA HCG BLOOD, ED (MC, WL, AP ONLY)

## 2014-11-14 LAB — LIPASE, BLOOD: Lipase: 28 U/L (ref 11–51)

## 2014-11-14 MED ORDER — ONDANSETRON HCL 4 MG PO TABS: 4.0000 mg | ORAL_TABLET | Freq: Three times a day (TID) | ORAL | Status: DC | PRN

## 2014-11-14 MED ORDER — OMEPRAZOLE 20 MG PO CPDR: 20.0000 mg | DELAYED_RELEASE_CAPSULE | Freq: Two times a day (BID) | ORAL | Status: DC

## 2014-11-14 MED ORDER — PANTOPRAZOLE SODIUM 40 MG PO TBEC
40.0000 mg | DELAYED_RELEASE_TABLET | Freq: Once | ORAL | Status: AC
  Administered 2014-11-14: 40 mg via ORAL
  Filled 2014-11-14: qty 1

## 2014-11-14 MED ORDER — ONDANSETRON 4 MG PO TBDP
4.0000 mg | ORAL_TABLET | Freq: Once | ORAL | Status: AC
  Administered 2014-11-14: 4 mg via ORAL
  Filled 2014-11-14: qty 1

## 2014-11-14 NOTE — ED Provider Notes (Signed)
CSN: [id number]     Arrival date & time 11/14/14  1502 History   First MD Initiated Contact with Patient 11/14/14 1606     Chief Complaint  Patient presents with  . Hematemesis     (Consider location/radiation/quality/duration/timing/severity/associated sxs/prior Treatment) Patient is a 29 y.o. female presenting with vomiting.  Emesis Severity:  Moderate Duration:  9 hours Timing:  Intermittent Number of daily episodes:  4 Quality:  Bright red blood Progression:  Unchanged Chronicity:  New Relieved by:  Nothing Worsened by:  Nothing tried Ineffective treatments:  None tried Associated symptoms: abdominal pain   Associated symptoms: no diarrhea and no fever     Past Medical History  Diagnosis Date  . Asthma high school    Exercised induced  . ADHD (attention deficit hyperactivity disorder)   . Depression   . 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 No past surgeries     Family History  Problem Relation Age of Onset  . Seizures Mother   . Depression Father   . Depression Brother   . Anxiety disorder Sister    Social History  Substance Use Topics  . Smoking status: Never Smoker   . Smokeless tobacco: Never Used  . Alcohol Use: Yes     Comment: occasional   OB History    Gravida Para Term Preterm AB TAB SAB Ectopic Multiple Living   Review of Systems  Gastrointestinal: Positive for vomiting and abdominal pain. Negative for diarrhea.  All other systems reviewed and are negative.     Allergies  Review of patient's allergies indicates no known allergies.  Home Medications   Prior to Admission medications   Medication Sig Start Date End Date Taking? Authorizing Provider  ADDERALL XR 20 MG 24 hr capsule TK 1 C PO ONCE D IN THE MORNING FOR ADHD 10/03/14   Historical Provider, MD  amphetamine-dextroamphetamine (ADDERALL) 10 MG tablet TAKE 1 TABLET BY MOUTH  4 TIMES DAILY 08/23/14   Historical Provider, MD  amphetamine-dextroamphetamine (ADDERALL) 5 MG tablet TK 1 T PO QD IN THE AFTERNOON 10/03/14   Historical Provider, MD  buPROPion (WELLBUTRIN XL) 150 MG 24 hr tablet TK 1 T PO QD FOR 7 DAYS THEN TK 2 TS PO QD 10/03/14   Historical Provider, MD  cephALEXin (KEFLEX) 500 MG capsule 2 caps po bid x 7 days 09/21/14   Mercedes Camprubi-Soms, PA-C  ferrous sulfate (FERROUSUL) 325 (65 FE) MG tablet Take 1 tablet (325 mg total) by mouth daily with breakfast. 04/24/12   Haroldine Laws, CNM  HYDROcodone-acetaminophen (NORCO/VICODIN) 5-325 MG per tablet Take 1 tablet by mouth every 4 (four) hours as needed. 09/24/14   Hayden Rasmussen, NP  ibuprofen (ADVIL,MOTRIN) 600 MG tablet Take 1 tablet (600 mg total) by mouth every 6 (six) hours. 04/24/12   Haroldine Laws, CNM  pantoprazole (PROTONIX) 40 MG tablet Take 40 mg by mouth daily. 02/24/12   Silverio Lay, MD  Prenatal Vit-Fe Fumarate-FA (PRENATAL MULTIVITAMIN) TABS Take 1 tablet by mouth every morning.    Historical Provider, MD  sulfamethoxazole-trimethoprim (BACTRIM DS,SEPTRA DS) 800-160 MG per tablet Take 1 tablet by mouth 2 (two) times daily. 09/21/14   Mercedes Camprubi-Soms, PA-C   BP 107/67 mmHg  Pulse 96  Temp(Src) 98.2 F (36.8 C) (Oral)  Resp 16  LMP 10/31/2014 Physical Exam  Constitutional: She is oriented to  person, place, and time. She appears well-developed and well-nourished. No distress.  HENT:  Head: Normocephalic and atraumatic.  Mouth/Throat: Oropharynx is clear and moist.  Eyes: Conjunctivae are normal. Pupils are equal, round, and reactive to light. No scleral icterus.  Neck: Neck supple.  Cardiovascular: Normal rate, regular rhythm, normal heart sounds and intact distal pulses.   No murmur heard. Pulmonary/Chest: Effort normal and breath sounds normal. No stridor. No respiratory distress. She has no rales.  Abdominal: Soft. Bowel sounds are normal. She exhibits no distension. There is tenderness  (mild) in the right upper quadrant and epigastric area. There is no rigidity, no rebound, no guarding and no CVA tenderness.  Musculoskeletal: Normal range of motion.  Neurological: She is alert and oriented to person, place, and time.  Skin: Skin is warm and dry. No rash noted.  Psychiatric: She has a normal mood and affect. Her behavior is normal.  Nursing note and vitals reviewed.   ED Course  Procedures (including critical care time) Labs Review Labs Reviewed  COMPREHENSIVE METABOLIC PANEL - Abnormal; Notable for the following:    Total Bilirubin 2.4 (*)    All other components within normal limits  CBC  LIPASE, BLOOD  I-STAT BETA HCG BLOOD, ED ([facility], [facility], [facility] ONLY)    Imaging Review No results found. I have personally reviewed and evaluated these images and lab results as part of my medical decision-making.   EKG Interpretation None      MDM   Final diagnoses:  Hematemesis with nausea    29 yo female with 4 episodes of hematemesis today.  Per her report, started as bright red blood and has continued through the day intermittently.  She felt normal when she went to bed last night.  Throughout the day she has developed some mild epigastric and LUQ pain.    On exam, she is very well appearing, sitting upright in bed.  Abd soft but mildly tender in epigastrium.    She also reports a history of severe GERD during her pregnancy a few years ago.    She declined fecal occult blood testing.    Plan labs, zofran, protonix.    6:25 PM we discussed her labs including mildly elevated bilirubin. She had very mild pain.  She had no significant tenderness. She tolerated PO and she did not vomit.  I don't think she needs imaging due to her well appearance and reassuring abdominal exams.  She does agree to come back if symptoms worsen.  She will follow up with GI and a PCP.    Blake DivineJohn Loise Esguerra, MD 11/15/14 [phone]

## 2014-11-14 NOTE — Discharge Instructions (Signed)
Hematemesis Hematemesis is when you vomit blood. It is a sign of bleeding in the upper part of your digestive tract. This is also called your gastrointestinal (GI) tract. Your upper GI tract includes your mouth, throat, esophagus, stomach, and the first part of your small intestine (duodenum).  Hematemesis is usually caused by bleeding from your esophagus or stomach. You may suddenly vomit bright red blood. You might also vomit old blood. It may look like coffee grounds. You may also have other symptoms, such as:  Stomach pain.  Heartburn.  Black and tarry stool.  HOME CARE INSTRUCTIONS  Watch your hematemesis for any changes. The following actions may help to lessen any discomfort you are feeling:  Take medicines only as directed by your health care provider. Do not take aspirin, ibuprofen, or any other anti-inflammatory medicine without approval from your health care provider.  Rest as needed.  Drink small sips of clear liquids often, as long as you can keep them down. Try to drink enough fluids to keep your urine clear or pale yellow.  Do not drink alcohol.  Do not use any tobacco products, including cigarettes, chewing tobacco, or electronic cigarettes. If you need help quitting, ask your health care provider.  Keep all follow-up visits as directed by your health care provider. This is important. SEEK MEDICAL CARE IF:   The vomiting of blood worsens, or begins again after it has stopped.  You have persistent stomach pain.  You have nausea, indigestion, or heartburn.  You feel weak or dizzy. SEEK IMMEDIATE MEDICAL CARE IF:   You faint or feel extremely weak.  You have a rapid heartbeat.  You are urinating less than normal or not at all.  You have persistent vomiting.  You vomit large amounts of bloody or dark material.  You vomit bright red blood.  You pass large, dark, or bloody stools.  You have chest pain or trouble breathing.   This information is not  intended to replace advice given to you by your health care provider. Make sure you discuss any questions you have with your health care provider.   Document Released: 02/19/2004 Document Revised: 02/01/2014 Document Reviewed: 09/05/2013 Elsevier Interactive Patient Education 2016 Elsevier In   Emergency Department Resource Guide 1) Find a Doctor and Pay Out of Pocket Although you won't have to find out who is covered by your insurance plan, it is a good idea to ask around and get recommendations. You will then need to call the office and see if the doctor you have chosen will accept you as a new patient and what types of options they offer for patients who are self-pay. Some doctors offer discounts or will set up payment plans for their patients who do not have insurance, but you will need to ask so you aren't surprised when you get to your appointment.  2) Contact Your Local Health Department Not all health departments have doctors that can see patients for sick visits, but many do, so it is worth a call to see if yours does. If you don't know where your local health department is, you can check in your phone book. The CDC also has a tool to help you locate your state's health department, and many state websites also have listings of all of their local health departments.  3) Find a Walk-in Clinic If your illness is not likely to be very severe or complicated, you may want to try a walk in clinic. These are popping up all over  the country in pharmacies, drugstores, and shopping centers. They're usually staffed by nurse practitioners or physician assistants that have been trained to treat common illnesses and complaints. They're usually fairly quick and inexpensive. However, if you have serious medical issues or chronic medical problems, these are probably not your best option.  No Primary Care Doctor: - Call Health Connect at  [phone] - they can help you locate a primary care doctor that   accepts your insurance, provides certain services, etc. - Physician Referral Service- [phone]  Chronic Pain Problems: Organization         Address  Phone   Notes  [facility]  [phone] Patients need to be referred by their primary care doctor.   Medication Assistance: Organization         Address  Phone   Notes  [facility] Medication [facility] [address] [place], [place] [address] [phone] --Must be a resident of [facility] -- Must have NO insurance coverage whatsoever (no Medicaid/ Medicare, etc.) -- The pt. MUST have a primary care doctor that directs their care regularly and follows them in the community   Med[facility]ist  [phone]   [employer]  [phone]    Agencies that provide inexpensive medical care: Organization         Address  Phone   Notes  [facility]  [phone]   [facility]    [phone]   [facility] [address] [place], [place] [address] [phone]   [facility] [address] [place], [place] [phone]   Planned Parenthood    [phone]   [facility]    [phone]   [facility]  [address], [place] Phone:  [phone], Fax:  [phone] Hours of Operation:  9 am - 6 pm, M-F.  Also accepts Medicaid/Medicare and self-pay.  [facility]  [address], [place] Phone: [phone], Fax: [phone]. Hours of Operation:  8:30 am - 5:30 pm, M-F.  Also accepts Medicaid and self-pay.  [facility] [place] [address], [place] Phone: [phone]   [facility] [address] Natasha Bence [place], [place] [phone], Ext. [phone] Mondays & Thursdays: 7-9 AM.  First 15 patients are seen on a first come, first serve basis.    Medicaid-accepting [facility] Providers:  Organization          Address  Phone   Notes  [facility] [address], [place] [phone] Also accepts self-pay patients.  [facility] [address] Laurell Josephs [place], [place]  [phone]   [facility] [address], [place] [phone]   [facility] [address], [place] [phone]   Renaye Rakers [address], [place]   [phone] Only accepts [place] Access [place] patients after they have their name applied to their card.   Self-Pay (no insurance) in [facility]:  Organization         Address  Phone   Notes  Sickle Cell Patients, [facility] [address] [place], [place] [phone]   [facility] [address] [place], [place] [phone]   [facility]  [address], [place] [phone]   [facility]/Dr. Osei-Bonsu  [address], [place] or [address], [place] [phone] Phone number for both [place] and [place] locations is the same.  [facility] [address] [place] [phone]   [facility] [address] [place] or [address] [phone]   [facility] [address], [place] [phone], phone; [phone], fax Sees patients 1st and 3rd Saturday of every month.  Must not qualify for public or private insurance (i.e. Medicaid, Medicare, [place] Health Choice, Veterans' Benefits)  Household income should be no more than 200% of the poverty level The clinic cannot treat you if you are pregnant or think you are pregnant  Sexually transmitted diseases are not treated at the clinic.    Dental Care: Organization         Address  Phone  Notes  [facility] Department of [facility] [address] [place],  [place] [phone] Accepts children up to age 67 who are enrolled in [place] or [place] Health Choice; pregnant women with a Medicaid card; and children who have applied for Medicaid or [place] Health Choice, but were declined, whose parents can pay a reduced fee at time of service.  [facility] of [facility]  [address], [place] [phone] Accepts children up to age 81 who are enrolled in [place] or [place] Health Choice; pregnant women with a Medicaid card; and children who have applied for Medicaid or  Health Choice, but were declined, whose parents can pay a reduced fee at time of service.  [facility]  [address] [place], [place] [phone] Patients are seen by appointment only. Walk-ins are not accepted. [facility] will see patients 45 years of age and older. Monday - Tuesday (8am-5pm) Most Wednesdays (8:30-5pm) $30 per visit, cash only  [facility]  [address], [facility] [phone] Patients are seen by appointment only. Walk-ins are not accepted. [facility] will see patients 6 years of age and older. One Wednesday Evening (Monthly: Volunteer Based).  $30 per visit, cash only  [employer]  [phone] for adults; Children under age 17, call Graduate Pediatric Dentistry at [phone]. Children aged 45-14, please call [phone] to request a pediatric application.  Dental services are provided in all areas of dental care including fillings, crowns and bridges, complete and partial dentures, implants, gum treatment, root canals, and extractions. Preventive care is also provided. Treatment is provided to both adults and children. Patients are selected via a lottery and there is often a waiting list.   [facility] [address] [place]  [phone] [email or web address]   [facility] [address] [place], [place]  [phone], Ext. [phone] Second and Fourth Thursday of each month, opens at 6:30 AM; Clinic ends at 9 AM.  Patients are seen on a first-come first-served basis, and a limited number are seen during each clinic.   [facility]  [address] Ether Griffins [place], [place] [phone]   Eligibility Requirements You must have lived in [place], [place], or [place] counties for at least the last three months.   You cannot be eligible for state or federal sponsored [place], including [employer], [place], or [employer].  You generally cannot be eligible for healthcare insurance through your employer.    How to apply: Eligibility screenings are held every Tuesday and Wednesday afternoon from 1:00 pm until 4:00 pm. You do not need an appointment for the interview!  [facility] [address], [place], [place]   [place]ole O[place]a  [place] Department  [place]  [phone]    Behavioral Health Resources in the Community: Intensive Outpatient Programs Organization         Address  Phone  Notes  [place]. [address] [place], [place] [phone]   [facility] [address] [place], [place]   ADS: [facility] [address] [place], [place]   [place] [address]  [place], [place] or [phone]   Substance Abuse Resources Organization         Address  Phone  Notes  [facility] Drug Services  [phone]   [place]  [phone]   [facility] [place]   Chinita Pester  [phone]   Residential & Outpatient Substance Abuse Program  [phone]   Psychological Services Organization         Address  Phone  Notes  [facility]  [place]  [phone]    [place] [address], [place] or [phone]    Mobile Crisis Teams Organization         Address  Phone  Notes  [facility], Mobile Crisis Care Unit  [phone]   [facility]  [address] [place], [place]   [place] Levels [address], [place] [phone]    Self-Help/Support Groups Organization         Address  Phone             Notes  [place] [facility] [place] - variety of support groups  [place] Call for more information  Narcotics Anonymous (NA), [facility] [address], [employer] [place]  2 meetings at this location   Special educational needs teacher         Address  Phone  Notes  [facility] Residential Treatment [place],    [place]  [phone]   [facility]  [address], [place] [phone], [place], [place]   [place], [place] [phone] Admissions: 8am-3pm M-F  Incentives Substance [place] [address],    [place], [place] [phone]   [facility] [address] [place], [place], [place]   [facility] [address],  [place], [place]   [facility] - Intensive Outpatient [address] Kristeen [address], [place], [place]   [facility]  [place], [place] [phone] or [phone]   [facility] (RT[facility]) [address], [place], [place] Accepts Medicaid  [facility] [place] [address],  [place] [phone] Substance Abuse/Addiction Treatment   [facility] Organization         Address  Phone  Notes  [facility]  [phone]   Domenic Schwab, PhD [address] [place], [place]   [phone] or [phone]   [facility]   [phone]  [address] [place], [place] [phone]   [facility] [address] [place], [place] [phone] Insurance/Medicaid/sponsorship through [facility] and Families [address], [address] [place], [place] [phone] [place], [place] [phone]    Dr. Adele Schilder  [phone]   [facility] Dept. 1) [address], [place] 2) [place] 3)  [place] [address], [place] [phone]  [phone]   [place] [phone] or [phone] (After Hours)

## 2014-11-14 NOTE — ED Notes (Signed)
Per pt, states she woke up this am and vomited some "redhas some abdominal burning and pain

## 2016-08-27 ENCOUNTER — Encounter (HOSPITAL_COMMUNITY): Payer: Self-pay | Admitting: Emergency Medicine

## 2016-08-27 ENCOUNTER — Emergency Department (HOSPITAL_COMMUNITY): Payer: Medicaid Other

## 2016-08-27 ENCOUNTER — Emergency Department (HOSPITAL_COMMUNITY)
Admission: EM | Admit: 2016-08-27 | Discharge: 2016-08-28 | Disposition: A | Discharge status: Home / Self Care | Payer: Medicaid Other | Attending: Emergency Medicine | Admitting: Emergency Medicine

## 2016-08-27 DIAGNOSIS — N10 Acute tubulo-interstitial nephritis: Secondary | ICD-10-CM | POA: Insufficient documentation

## 2016-08-27 DIAGNOSIS — Z79899 Other long term (current) drug therapy: Secondary | ICD-10-CM | POA: Insufficient documentation

## 2016-08-27 DIAGNOSIS — N12 Tubulo-interstitial nephritis, not specified as acute or chronic: Secondary | ICD-10-CM

## 2016-08-27 DIAGNOSIS — R109 Unspecified abdominal pain: Secondary | ICD-10-CM | POA: Diagnosis present

## 2016-08-27 LAB — URINALYSIS, ROUTINE W REFLEX MICROSCOPIC
Bilirubin Urine: NEGATIVE
Glucose, UA: NEGATIVE mg/dL
HGB URINE DIPSTICK: NEGATIVE
Ketones, ur: NEGATIVE mg/dL
NITRITE: NEGATIVE
Protein, ur: NEGATIVE mg/dL
SPECIFIC GRAVITY, URINE: 1.013 (ref 1.005–1.030)
pH: 9 — ABNORMAL HIGH (ref 5.0–8.0)

## 2016-08-27 LAB — POC URINE PREG, ED: PREG TEST UR: NEGATIVE

## 2016-08-27 MED ORDER — SODIUM CHLORIDE 0.9 % IV BOLUS (SEPSIS)
2000.0000 mL | Freq: Once | INTRAVENOUS | Status: AC
  Administered 2016-08-28: 2000 mL via INTRAVENOUS

## 2016-08-27 MED ORDER — ACETAMINOPHEN 325 MG PO TABS
650.0000 mg | ORAL_TABLET | Freq: Once | ORAL | Status: AC
  Administered 2016-08-28: 650 mg via ORAL
  Filled 2016-08-27: qty 2

## 2016-08-27 NOTE — ED Triage Notes (Signed)
Pt states she thinks she may have a kidney infection  Pt states she woke up this morning with right flank pain and throughout the day she started to have general weakness, chills, and fever  Pt states she has hx of kidney infections and kidney stones

## 2016-08-28 LAB — CBC
HCT: 39.5 % (ref 36.0–46.0)
Hemoglobin: 12.8 g/dL (ref 12.0–15.0)
MCH: 29 pg (ref 26.0–34.0)
MCHC: 32.4 g/dL (ref 30.0–36.0)
MCV: 89.4 fL (ref 78.0–100.0)
PLATELETS: 179 10*3/uL (ref 150–400)
RBC: 4.42 MIL/uL (ref 3.87–5.11)
RDW: 12.2 % (ref 11.5–15.5)
WBC: 8.5 10*3/uL (ref 4.0–10.5)

## 2016-08-28 LAB — BASIC METABOLIC PANEL
Anion gap: 9 (ref 5–15)
BUN: 11 mg/dL (ref 6–20)
CHLORIDE: 105 mmol/L (ref 101–111)
CO2: 25 mmol/L (ref 22–32)
CREATININE: 0.84 mg/dL (ref 0.44–1.00)
Calcium: 9.4 mg/dL (ref 8.9–10.3)
GFR calc Af Amer: >60 mL/min (ref >60)
GLUCOSE: 99 mg/dL (ref 65–99)
POTASSIUM: 3.7 mmol/L (ref 3.5–5.1)
Sodium: 139 mmol/L (ref 135–145)

## 2016-08-28 LAB — I-STAT CG4 LACTIC ACID, ED: Lactic Acid, Venous: 1.5 mmol/L (ref 0.5–1.9)

## 2016-08-28 MED ORDER — CIPROFLOXACIN HCL 500 MG PO TABS: 500.0000 mg | ORAL_TABLET | Freq: Two times a day (BID) | ORAL | 0 refills | Status: DC

## 2016-08-28 MED ORDER — DEXTROSE 5 % IV SOLN
1.0000 g | INTRAVENOUS | Status: DC
  Administered 2016-08-28: 1 g via INTRAVENOUS
  Filled 2016-08-28: qty 10

## 2016-08-28 NOTE — Discharge Instructions (Signed)
Please read attached information. If you experience any new or worsening signs or symptoms please return to the emergency room for evaluation. Please follow-up with your primary care provider or specialist as discussed. Please use medication prescribed only as directed and discontinue taking if you have any concerning signs or symptoms.   °

## 2016-08-28 NOTE — ED Provider Notes (Signed)
  [age] YOF signed out to me at shift change by previous provider pending ultrasound. Please see previous provider's note for full H&P. In short patient presents with right-sided flank pain starting today. She reports fever and chills, denies any significant urinary changes. Patient does note a history of similar presentation in [place] in which she had pyelonephritis and was treated with Bactrim.    Patient initially febrile and tachycardic but well-appearing. Normal lactic acid no elevation in white blood cells, urinalysis consistent with urinary tract infection.  No other noted source of infection. Patient had ultrasound here which showed no significant findings most notably no hydronephrosis.  Patient's presentation is most consistent with pyelonephritis. She was given ceftriaxone, normal saline, and ibuprofen.  Upon reassessment patient well appearing in no acute distress. She has a soft nontender abdomen, Patient afebrile tolerating by mouth. Patient is stable for discharge at this time. Patient does not want Bactrim as this causes significant reaction in the past, she'll be started on Cipro with urine culture pending. She was given strict return precautions, she verbalized understanding and agreement to today's plan had no further questions or concerns at the time discharge.  Vitals:   08/28/16 0230 08/28/16 0249  BP:  (!) 99/59  Pulse:  95  Resp:  14  Temp: 98.4 F (36.9 C)        Eyvonne MechanicHedges, Jawanza Zambito, PA-C 08/28/16 [id number]    Ward, Layla MawKristen N, DO 08/28/16 [phone]

## 2016-08-28 NOTE — ED Provider Notes (Signed)
[facility]-EMERGENCY DEPT Provider Note   CSN: [id number] Arrival date & time: 08/27/16  2246     [id number]   Chief Complaint Chief Complaint  Patient presents with  . Flank Pain     HPI   Blood pressure (!) 140/112, pulse (!) 110, temperature (!) 100.6 F (38.1 C), temperature source Oral, resp. rate 20, height 5\' 7"  (1.702 m), weight 55.3 kg (122 lb), SpO2 100 %, unknown if currently breastfeeding.  Erin Ayala is a [age] y.o. female complaining of right flank pain waking this morning and feeling generally fatigued, throughout the day she started having fever, chills, general weakness. She denies any dysuria, hematuria, urinary frequency. She has a history of kidney stones, she's never required any intervention to pass the stones. He states that the pain earlier was severe but is subsided significantly. No medication taken prior to arrival. Denies nausea, vomiting, anterior abdominal pain, abnormal vaginal discharge.  Past Medical History:  Diagnosis Date  . ADHD (attention deficit hyperactivity disorder)   . Asthma high school   Exercised induced  . Depression   . Fetal heart rate decelerations affecting management of mother 04/21/2012    Patient Active Problem List   Diagnosis Date Noted  . Vacuum extractor delivery, delivered 04/22/2012  . Asthma 10/12/2011  . ADD (attention deficit disorder) 10/12/2011  . Depression 10/12/2011  . Irregular periods/menstrual cycles 10/12/2011    Past Surgical History:  Procedure Laterality Date  . NO PAST SURGERIES      OB History    Gravida Para Term Preterm AB Living   1 1 1     1    SAB TAB Ectopic Multiple Live Births           1       Home Medications    Prior to Admission medications   Medication Sig Start Date End Date Taking? Authorizing Provider  amphetamine-dextroamphetamine (ADDERALL XR) 15 MG 24 hr capsule Take 15 mg by mouth daily.   Yes [provider]  amphetamine-dextroamphetamine (ADDERALL) 5 MG tablet  Take 5 mg by mouth daily as needed (to focus).   Yes [provider]  buPROPion (WELLBUTRIN XL) 300 MG 24 hr tablet Take 300 mg by mouth daily.   Yes [provider]    Family History Family History  Problem Relation Age of Onset  . Seizures Mother   . Depression Father   . Depression Brother   . Anxiety disorder Sister     Social History Social History  Substance Use Topics  . Smoking status: Never Smoker  . Smokeless tobacco: Never Used  . Alcohol use No     Comment: occasional     Allergies   Patient has no known allergies.   Review of Systems Review of Systems  A complete review of systems was obtained and all systems are negative except as noted in the HPI and PMH.    Physical Exam Updated Vital Signs BP (!) 140/112 (BP Location: Left Arm)   Pulse (!) 110   Temp (!) 100.6 F (38.1 C) (Oral)   Resp 20   Ht 5\' 7"  (1.702 m)   Wt 55.3 kg (122 lb)   SpO2 100%   BMI 19.11 kg/m   Physical Exam  Constitutional: She is oriented to person, place, and time. She appears well-developed and well-nourished. No distress.  HENT:  Head: Normocephalic.  Mouth/Throat: Oropharynx is clear and moist.  Eyes: Conjunctivae and EOM are normal.  Neck: Normal range of motion.  Cardiovascular: Normal rate.   Pulmonary/Chest: Effort normal. No stridor.  Abdominal: Soft. Bowel sounds are normal. She exhibits no distension and no mass. There is no tenderness. There is no rebound and no guarding.  Genitourinary:  Genitourinary Comments: No CVA tenderness to percussion bilaterally  Musculoskeletal: Normal range of motion.  Neurological: She is alert and oriented to person, place, and time.  Psychiatric: She has a normal mood and affect.  Nursing note and vitals reviewed.    ED Treatments / Results  Labs (all labs ordered are listed, but only abnormal results are displayed) Labs Reviewed  URINALYSIS, ROUTINE W REFLEX MICROSCOPIC - Abnormal; Notable for the  following:       Result Value   pH 9.0 (*)    Leukocytes, UA MODERATE (*)    Bacteria, UA RARE (*)    Squamous Epithelial / LPF 0-5 (*)    All other components within normal limits  URINE CULTURE  BASIC METABOLIC PANEL  CBC  POC URINE PREG, ED  I-STAT CG4 LACTIC ACID, ED    EKG  EKG Interpretation None       Radiology Dg Abdomen 1 View  Result Date: 08/28/2016 CLINICAL DATA:  31 year old female with right flank pain. EXAM: ABDOMEN - 1 VIEW COMPARISON:  None. FINDINGS: There is no bowel dilatation or evidence of obstruction. No free air. A 3 mm radiopaque focus over the right renal silhouette may be related to calcification of the costal cartilage or represent kidney stones. Ultrasound may provide better evaluation of the kidneys. The osseous structures and soft tissues appear unremarkable. IMPRESSION: 1. No evidence of bowel obstruction. 2. Calcification of the costal cartilage versus small right renal calculus. Ultrasound may provide better evaluation of the kidney clinically indicated. Electronically Signed   By: Elgie CollardArash  Radparvar M.D.   On: 08/28/2016 00:24   [place] Renal  Result Date: 08/28/2016 CLINICAL DATA:  Acute onset of right flank pain and fever. Initial encounter. EXAM: RENAL / URINARY TRACT ULTRASOUND COMPLETE COMPARISON:  Renal ultrasound performed 02/24/2012 FINDINGS: Right Kidney: Length: 10.9 cm. Echogenicity within normal limits. No mass or hydronephrosis visualized. Left Kidney: Length: 11.5 cm. Echogenicity within normal limits. No mass or hydronephrosis visualized. Bladder: Appears normal for degree of bladder distention. IMPRESSION: Unremarkable renal ultrasound.  No evidence of hydronephrosis. Electronically Signed   By: Roanna RaiderJeffery  Chang M.D.   On: 08/28/2016 00:46    Procedures Procedures (including critical care time)  Medications Ordered in ED Medications  cefTRIAXone (ROCEPHIN) 1 g in dextrose 5 % 50 mL IVPB (not administered)  acetaminophen (TYLENOL) tablet 650  mg (650 mg Oral Given 08/28/16 0015)  sodium chloride 0.9 % bolus 2,000 mL (2,000 mLs Intravenous New Bag/Given 08/28/16 0007)     Initial Impression / Assessment and Plan / ED Course  I have reviewed the triage vital signs and the nursing notes.  Pertinent labs & imaging results that were available during my care of the patient were reviewed by me and considered in my medical decision making (see chart for details).     Vitals:   08/27/16 2300 08/27/16 2301  BP: (!) 140/112   Pulse: (!) 110   Resp: 20   Temp: (!) 100.6 F (38.1 C)   TempSrc: Oral   SpO2: 100%   Weight:  55.3 kg (122 lb)  Height:  5\' 7"  (1.702 m)    Medications  cefTRIAXone (ROCEPHIN) 1 g in dextrose 5 % 50 mL IVPB (not administered)  acetaminophen (TYLENOL) tablet 650 mg (650 mg  Oral Given 08/28/16 0015)  sodium chloride 0.9 % bolus 2,000 mL (2,000 mLs Intravenous New Bag/Given 08/28/16 0007)    Erin Ayala is 31 y.o. female presenting with Fever, tachycardia, she reports a right sided flank pain improving throughout the day. She does not have any dysuria, hematuria, lower abdominal pain, abnormal vaginal discharge. Abdominal exam is benign, no CVA tenderness to percussion. She's had kidney stones in the past never required any intervention to pass them. No other etiology for her fever, urinalysis is consistent with infection with moderate leukocytes, nitrite negative, too numerous to count whites.  Patient nontoxic appearing, blood work pending, will get renal ultrasound and KUB.  Lactic acid normal, no significant leukocytosis. Urinalysis is consistent with infection, urine culture pending. KUB with a possible intrarenal stone. Case signed out to PA Hedges at shift change: Plan is to follow-up renal ultrasound to evaluate for hydronephrosis. Rocephin given.   Final Clinical Impressions(s) / ED Diagnoses   Final diagnoses:  None    New Prescriptions New Prescriptions   No medications on file       Kaylyn Lim 08/28/16 1610    Ward, Layla Maw, DO 08/28/16 0157

## 2016-08-30 LAB — URINE CULTURE: Culture: 70000 — AB

## 2016-08-31 ENCOUNTER — Telehealth: Payer: Self-pay | Admitting: Emergency Medicine

## 2016-08-31 NOTE — Telephone Encounter (Signed)
Post ED Visit - Positive Culture Follow-up  Culture report reviewed by antimicrobial stewardship pharmacist:  []  Enzo BiNathan Batchelder, Pharm.D. []  Celedonio MiyamotoJeremy Frens, [address]Ph[address]D., BCPS AQ-ID []  Garvin FilaMike Maccia, Pharm.D., BCPS []  Georgina PillionElizabeth Martin, Pharm.D., BCPS []  [place] FalmouthMinh Pham, [address]Pharm[address]D., BCPS, AAHIVP []  Estella HuskMichelle Turner, Pharm.D., BCPS, AAHIVP []  Lysle Pearlachel Rumbarger, PharmD, BCPS []  Casilda Carlsaylor Stone, PharmD, BCPS []  Pollyann SamplesAndy Johnston, PharmD, BCPS  Positive urine culture Treated with ciprofloxacin, organism sensitive to the same and no further patient follow-up is required at this time.  Berle MullMiller, Emillio Ngo 08/31/2016, 11:01 AM

## 2018-07-09 IMAGING — CR DG ABDOMEN 1V
1 series · 1 of 1 positions shown · non-contrast
Comparison: None.

CLINICAL DATA: 31-year-old female with right flank pain.

EXAM:
ABDOMEN - 1 VIEW

[t abdomen supine]
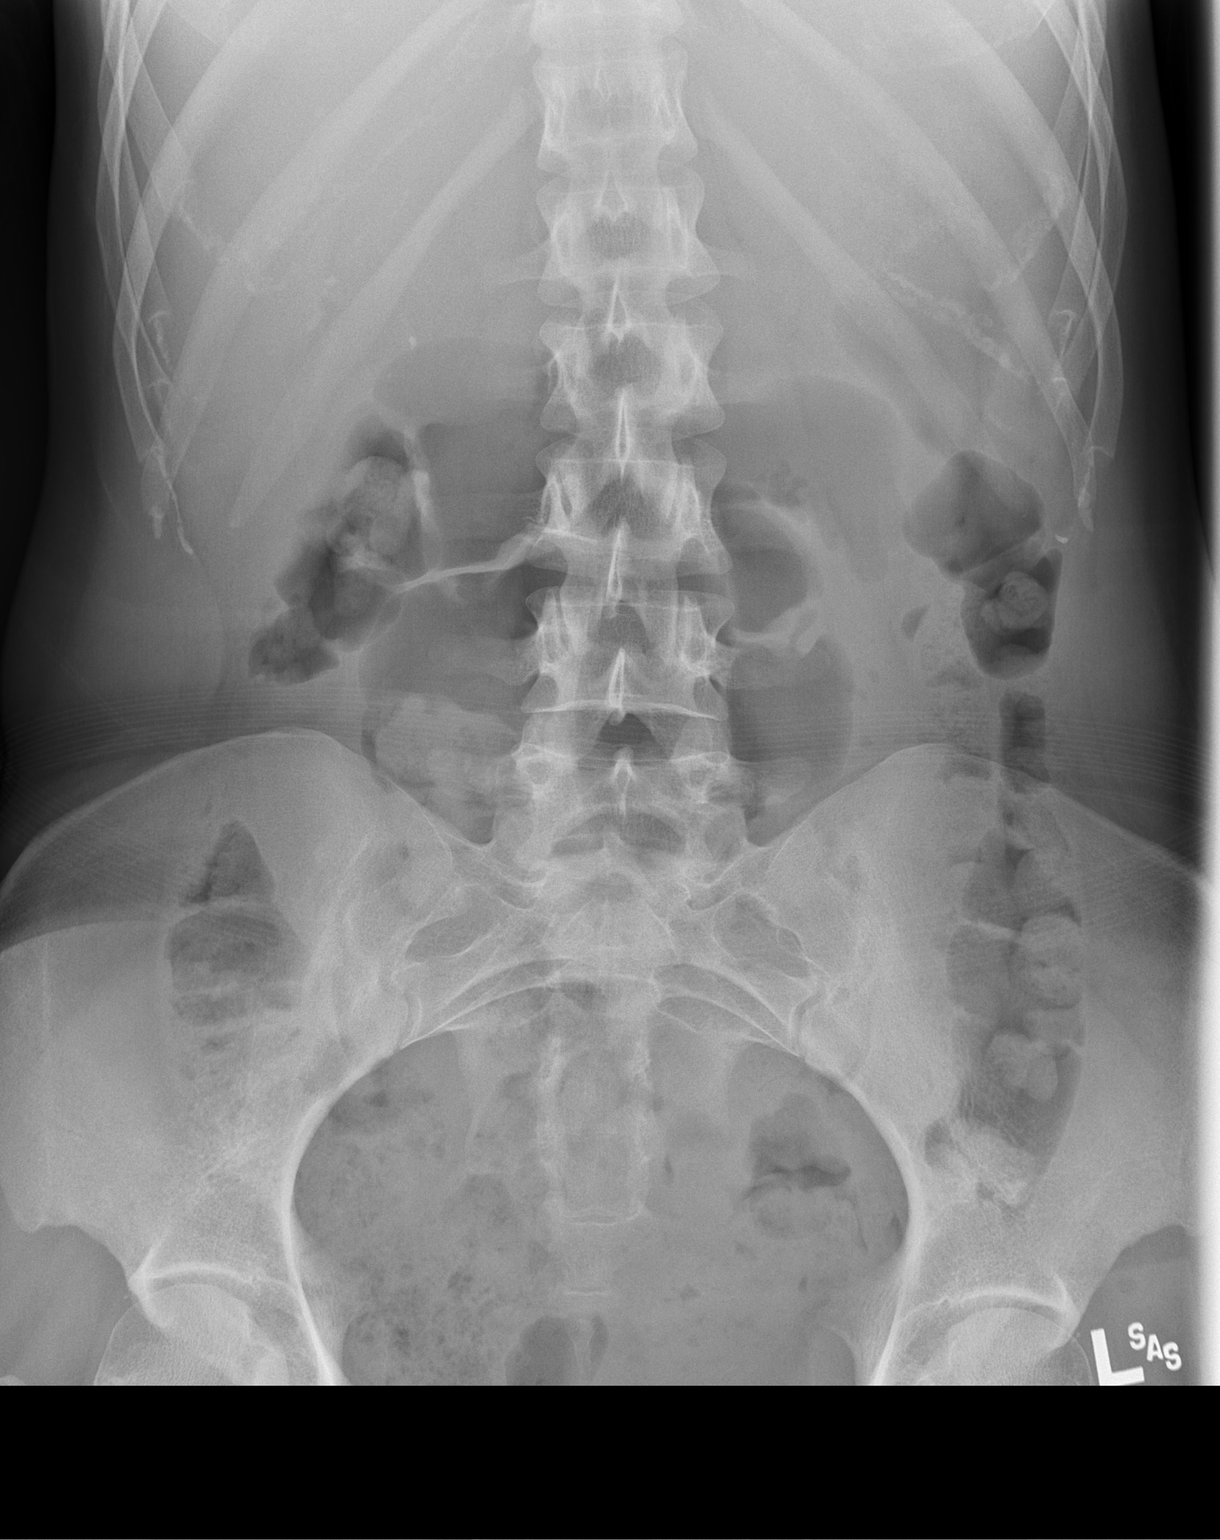

[1 of 1 positions shown; findings below may reference images not displayed]

FINDINGS: There is no bowel dilatation or evidence of obstruction. No free
air. A 3 mm radiopaque focus over the right renal silhouette may be
related to calcification of the costal cartilage or represent kidney
stones. Ultrasound may provide better evaluation of the kidneys. The
osseous structures and soft tissues appear unremarkable.
IMPRESSION: 1. No evidence of bowel obstruction.
2. Calcification of the costal cartilage versus small right renal
calculus. Ultrasound may provide better evaluation of the kidney
clinically indicated.

## 2018-07-31 IMAGING — US US RENAL
1 series · 14 of 25 positions shown · non-contrast
Comparison: Renal ultrasound performed 02/24/2012

CLINICAL DATA: Acute onset of right flank pain and fever. Initial
encounter.

EXAM:
RENAL / URINARY TRACT ULTRASOUND COMPLETE

[Series 1: us renal · 0.25mm/px · 14 of 37 slices shown]
[im 1/37]
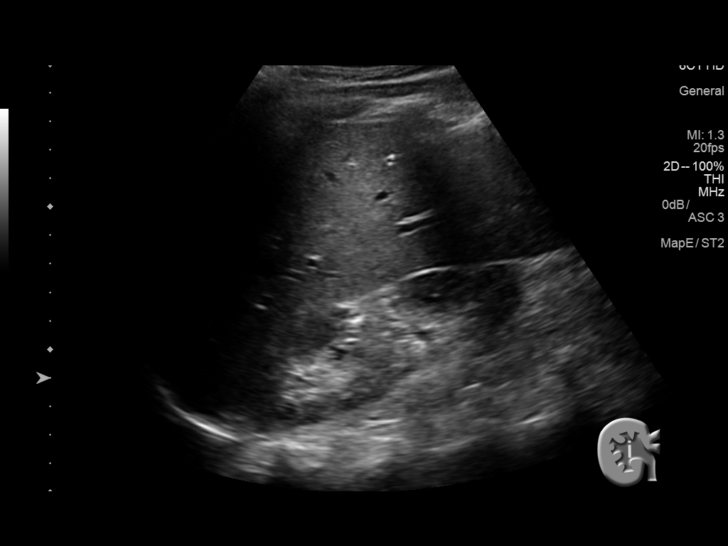
[im 4/37]
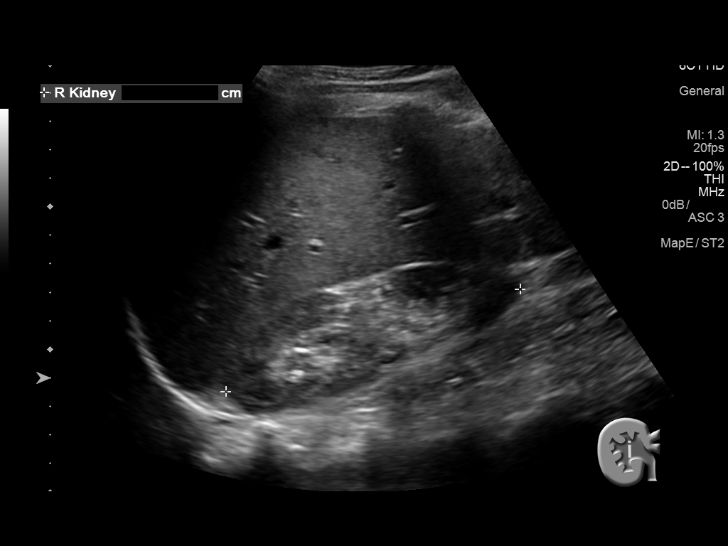
[im 7/37]
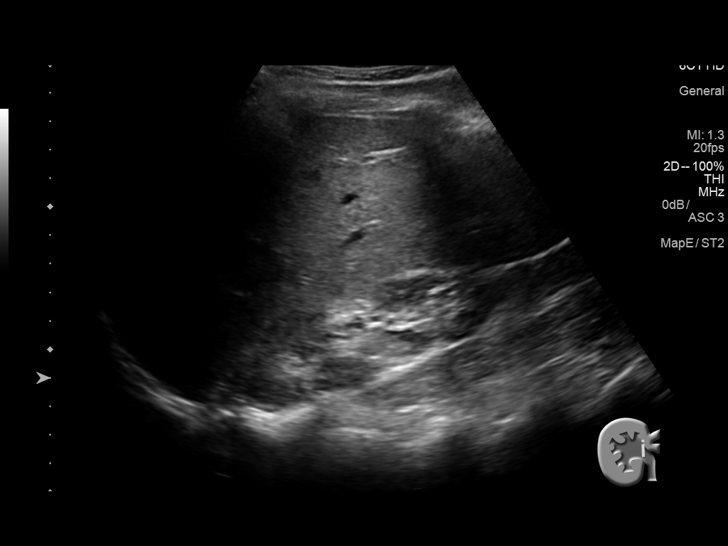
[im 10/37]
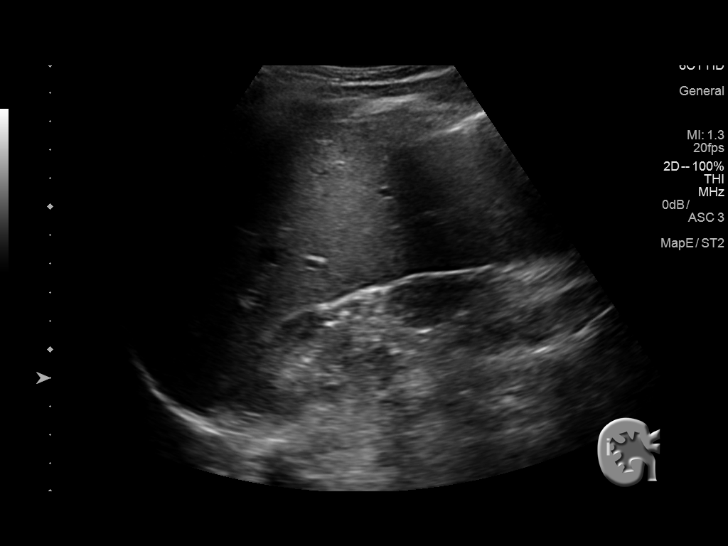
[im 13/37]
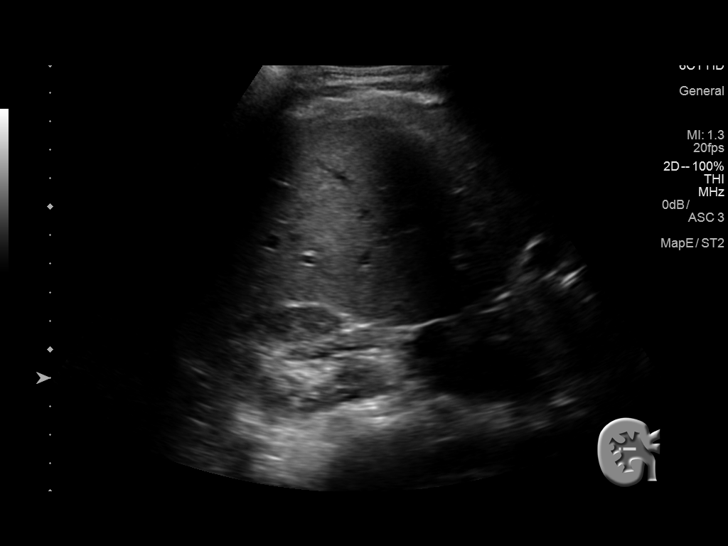
[im 14/37]
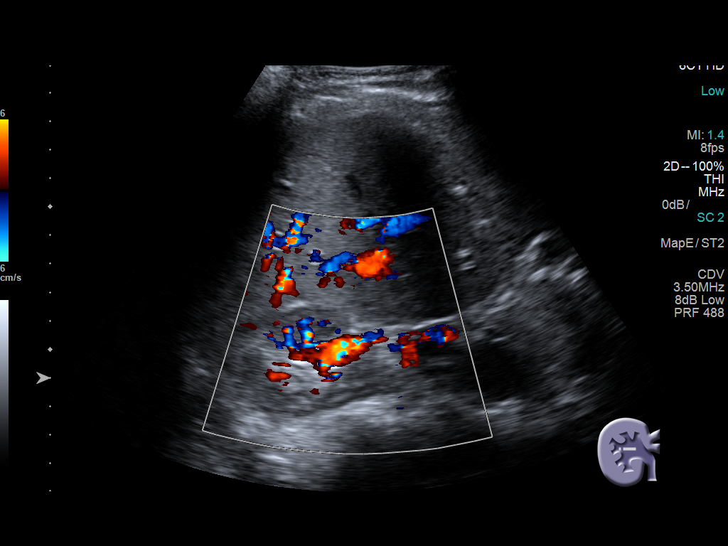
[im 17/37]
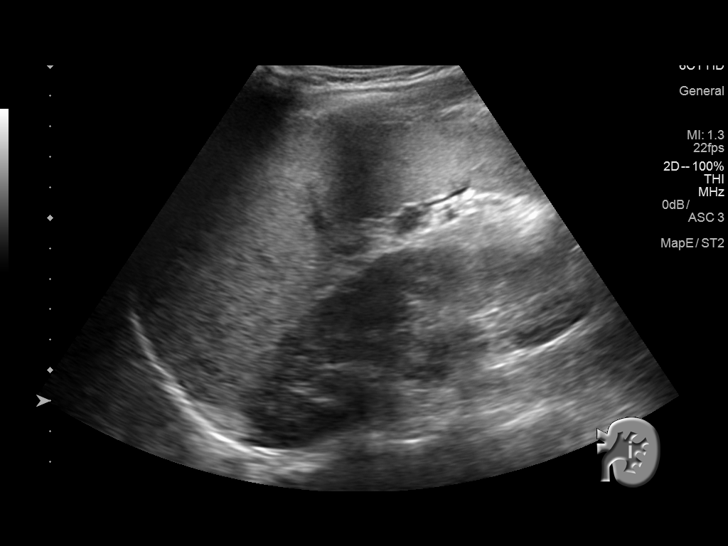
[im 20/37]
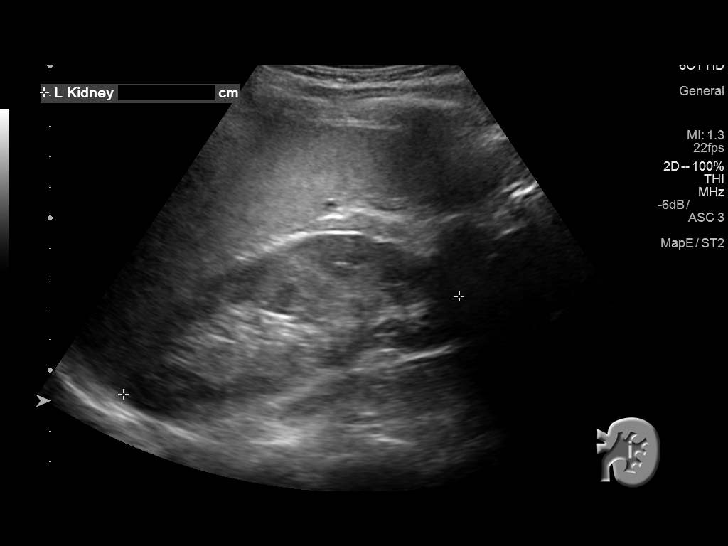
[im 23/37]
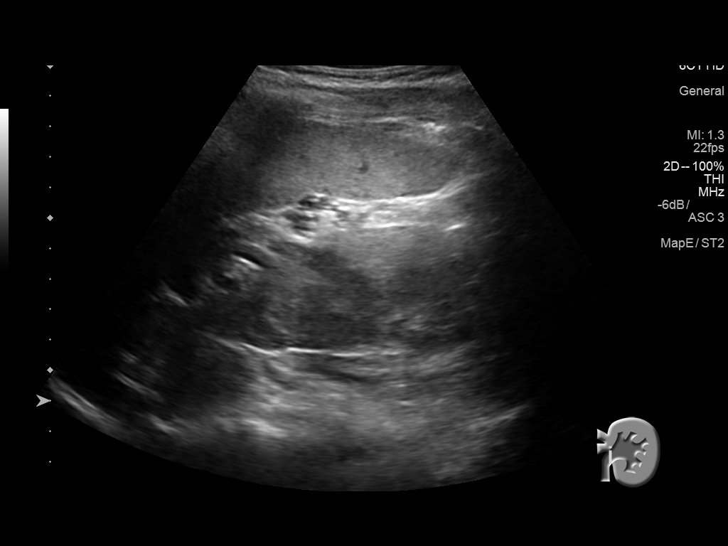
[im 25/37]
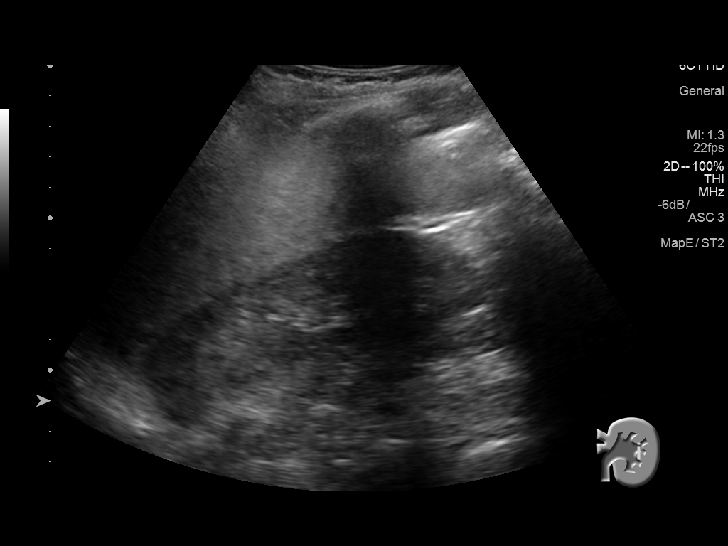
[im 28/37]
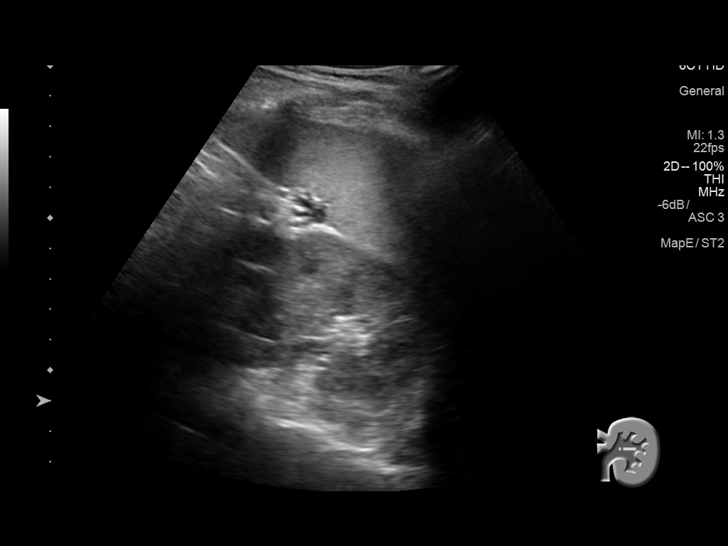
[im 31/37]
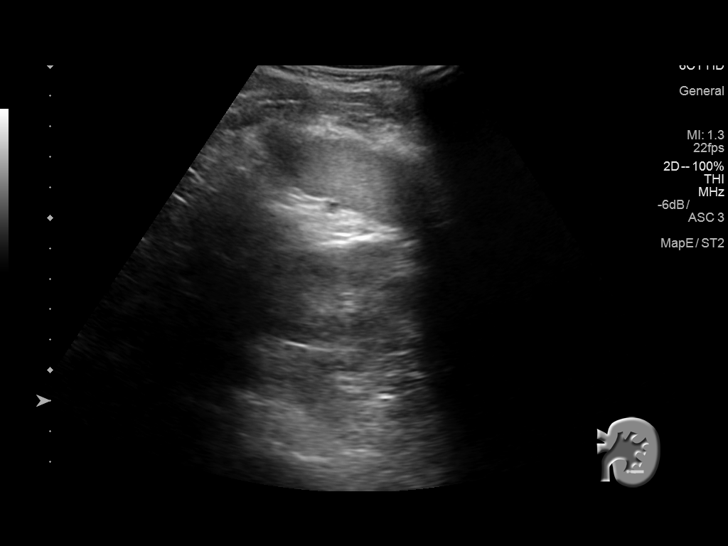
[im 34/37]
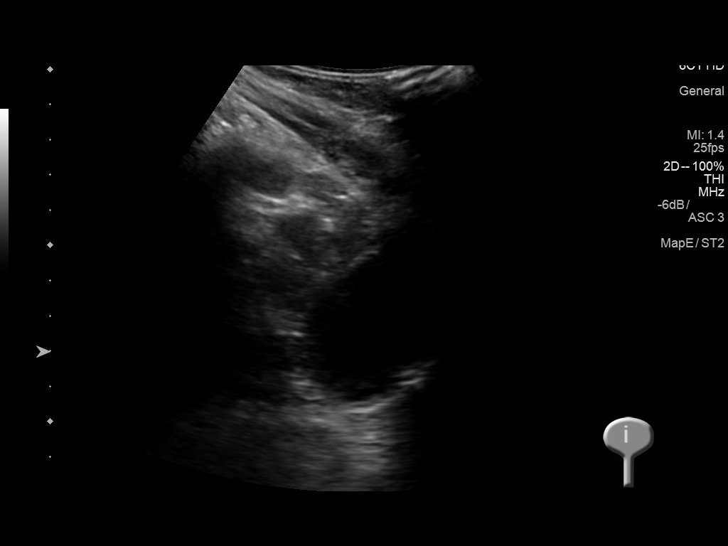
[im 37/37]
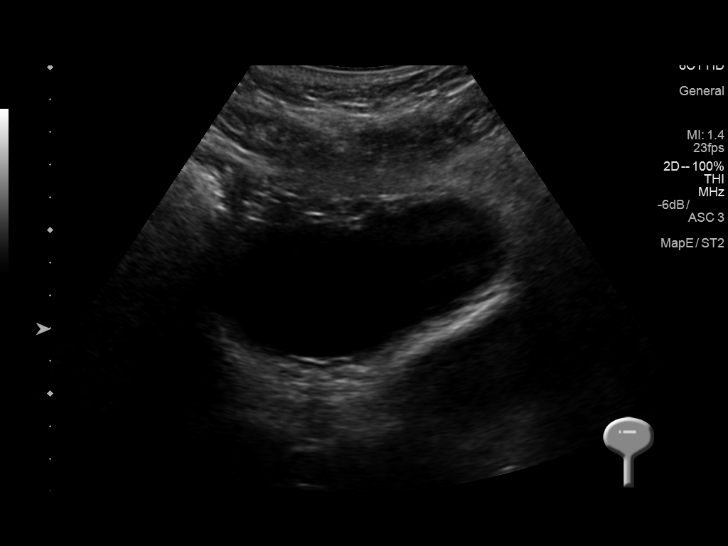

[14 of 25 positions shown; findings below may reference images not displayed]

FINDINGS: Right Kidney:

Length: 10.9 cm. Echogenicity within normal limits. No mass or
hydronephrosis visualized.

Left Kidney:

Length: 11.5 cm. Echogenicity within normal limits. No mass or
hydronephrosis visualized.

Bladder:

Appears normal for degree of bladder distention.
IMPRESSION: Unremarkable renal ultrasound.  No evidence of hydronephrosis.

## 2018-11-03 ENCOUNTER — Other Ambulatory Visit: Payer: Self-pay

## 2018-11-03 ENCOUNTER — Ambulatory Visit (HOSPITAL_COMMUNITY)
Admission: EM | Admit: 2018-11-03 | Discharge: 2018-11-03 | Disposition: A | Discharge status: Home / Self Care | Payer: Self-pay | Attending: Emergency Medicine | Admitting: Emergency Medicine

## 2018-11-03 ENCOUNTER — Encounter (HOSPITAL_COMMUNITY): Payer: Self-pay

## 2018-11-03 DIAGNOSIS — Z20828 Contact with and (suspected) exposure to other viral communicable diseases: Secondary | ICD-10-CM | POA: Insufficient documentation

## 2018-11-03 DIAGNOSIS — J039 Acute tonsillitis, unspecified: Secondary | ICD-10-CM

## 2018-11-03 DIAGNOSIS — R509 Fever, unspecified: Secondary | ICD-10-CM

## 2018-11-03 LAB — POCT RAPID STREP A: Streptococcus, Group A Screen (Direct): NEGATIVE

## 2018-11-03 MED ORDER — CEFTRIAXONE SODIUM 250 MG IJ SOLR
INTRAMUSCULAR | Status: AC
  Filled 2018-11-03: qty 250

## 2018-11-03 MED ORDER — DEXAMETHASONE SODIUM PHOSPHATE 10 MG/ML IJ SOLN
INTRAMUSCULAR | Status: AC
  Filled 2018-11-03: qty 2

## 2018-11-03 MED ORDER — CEFTRIAXONE SODIUM 1 G IJ SOLR
1.0000 g | Freq: Once | INTRAMUSCULAR | Status: AC
  Administered 2018-11-03: 15:00:00 1 g via INTRAMUSCULAR

## 2018-11-03 MED ORDER — DEXAMETHASONE SODIUM PHOSPHATE 10 MG/ML IJ SOLN: 10.0000 mg | Freq: Once | INTRAMUSCULAR | Status: DC

## 2018-11-03 MED ORDER — DEXAMETHASONE SODIUM PHOSPHATE 10 MG/ML IJ SOLN
20.0000 mg | Freq: Once | INTRAMUSCULAR | Status: AC
  Administered 2018-11-03: 15:00:00 20 mg via INTRAMUSCULAR

## 2018-11-03 MED ORDER — CLINDAMYCIN HCL 300 MG PO CAPS
300.0000 mg | ORAL_CAPSULE | Freq: Four times a day (QID) | ORAL | 0 refills | Status: AC
Start: 2018-11-03 — End: 2018-11-13

## 2018-11-03 MED ORDER — CEFTRIAXONE SODIUM 1 G IJ SOLR
INTRAMUSCULAR | Status: AC
  Filled 2018-11-03: qty 10

## 2018-11-03 MED ORDER — IBUPROFEN 600 MG PO TABS: 600.0000 mg | ORAL_TABLET | Freq: Four times a day (QID) | ORAL | 0 refills | Status: DC | PRN

## 2018-11-03 NOTE — ED Triage Notes (Signed)
Pt. States she has had a sore throat with ear pressure for a week.

## 2018-11-03 NOTE — Discharge Instructions (Signed)
Finish the clindamycin, even if you feel better.  We have given you steroids and initial dose of antibiotics here.  You may take 600 mg ibuprofen combined with 1 g of Tylenol 3-4 times a day as needed for pain.  Follow-up with Dr. Benjamine Mola at some point next week, especially if you are not getting any better.  If you get worse, or start having any of the other signs and symptoms we discussed, go to the emergency department.

## 2018-11-03 NOTE — ED Provider Notes (Addendum)
HPI  SUBJECTIVE:  Erin Ayala is a 33 y.o. female who presents with bilateral sore throat and fevers T-max 102 starting a week ago.  She states the sore throat has now localized to the right side.  She states the pain radiates to her ear.  She reports right-sided cervical lymphadenopathy, body aches while febrile.  She states that she feels like the right side of her throat is significantly swollen.  No nasal congestion, rhinorrhea, cough, shortness of breath.  No nausea, vomiting, diarrhea, abdominal pain.  No loss of sense of smell or taste.  No headaches, rash, drooling, trismus, difficulty breathing, neck stiffness, voice changes.  No change in her hearing, otorrhea.  No GERD or allergy symptoms.  No postnasal drip.  No exposure to COVID, strep, mono.  She has tried hot teas, salt water gargles, ibuprofen 800 mg 3 times daily with some improvement in her symptoms.  No aggravating factors.  She has a past medical history of asthma.  She is not a smoker.  No history of frequent strep, mono, diabetes, hypertension, GERD, allergies.  LMP: 2 weeks ago.  Denies possibility of being pregnant.  PMD: None.   Past Medical History:  Diagnosis Date  . ADHD (attention deficit hyperactivity disorder)   . Asthma high school   Exercised induced  . Depression   . Fetal heart rate decelerations affecting management of mother 04/21/2012    Past Surgical History:  Procedure Laterality Date  . NO PAST SURGERIES      Family History  Problem Relation Age of Onset  . Seizures Mother   . Depression Father   . Depression Brother   . Anxiety disorder Sister     Social History   Tobacco Use  . Smoking status: Never Smoker  . Smokeless tobacco: Never Used  Substance Use Topics  . Alcohol use: No    Comment: occasional  . Drug use: No    No current facility-administered medications for this encounter.   Current Outpatient Medications:  .  amphetamine-dextroamphetamine (ADDERALL XR) 15 MG 24 hr  capsule, Take 15 mg by mouth daily., Disp: , Rfl:  .  amphetamine-dextroamphetamine (ADDERALL) 5 MG tablet, Take 5 mg by mouth daily as needed (to focus)., Disp: , Rfl:  .  buPROPion (WELLBUTRIN XL) 300 MG 24 hr tablet, Take 300 mg by mouth daily., Disp: , Rfl:  .  clindamycin (CLEOCIN) 300 MG capsule, Take 1 capsule (300 mg total) by mouth 4 (four) times daily for 10 days., Disp: 40 capsule, Rfl: 0 .  ibuprofen (ADVIL) 600 MG tablet, Take 1 tablet (600 mg total) by mouth every 6 (six) hours as needed., Disp: 30 tablet, Rfl: 0  No Known Allergies   ROS  As noted in HPI.   Physical Exam  BP 109/79 (BP Location: Left Arm)   Pulse 88   Temp (!) 97.5 F (36.4 C) (Tympanic)   Resp 17   Wt 59 kg   LMP 10/23/2018   SpO2 100%   BMI 20.36 kg/m   Constitutional: Well developed, well nourished, no acute distress Eyes:  EOMI, conjunctiva normal bilaterally ENT: Right TM normal.  No nasal congestion.  Very erythematous right-sided tonsillar pillar, tonsil with extensive exudate.  No soft palate swelling, uvular deviation.  Airway patent.  No stridor, muffled voice. Neck: Positive right-sided tender anterior cervical lymphadenopathy.  No posterior cervical lymphadenopathy. Respiratory: Normal inspiratory effort Cardiovascular: Normal rate, no murmur GI: nondistended no splenomegaly skin: No rash, skin intact Musculoskeletal: no deformities  Neurologic: Alert & oriented x 3, no focal neuro deficits Psychiatric: Speech and behavior appropriate   ED Course   Medications  cefTRIAXone (ROCEPHIN) injection 1 g (1 g Intramuscular Given 11/03/18 1453)  dexamethasone (DECADRON) injection 20 mg (20 mg Intramuscular Given 11/03/18 1453)  cefTRIAXone (ROCEPHIN) 250 MG injection (has no administration in time range)  cefTRIAXone (ROCEPHIN) 1 g injection (has no administration in time range)  dexamethasone (DECADRON) 10 MG/ML injection (has no administration in time range)    Orders Placed This  Encounter  Procedures  . Novel Coronavirus, NAA (Hosp order, Send-out to Ref Lab; TAT 18-24 hrs    Standing Status:   Standing    Number of Occurrences:   1    Order Specific Question:   Is this test for diagnosis or screening    Answer:   Screening    Order Specific Question:   Symptomatic for COVID-19 as defined by CDC    Answer:   No    Order Specific Question:   Hospitalized for COVID-19    Answer:   No    Order Specific Question:   Admitted to ICU for COVID-19    Answer:   No    Order Specific Question:   Previously tested for COVID-19    Answer:   No    Order Specific Question:   Resident in a congregate (group) care setting    Answer:   No    Order Specific Question:   Employed in healthcare setting    Answer:   No    Order Specific Question:   Pregnant    Answer:   No  . Culture, group A strep (throat)    Standing Status:   Standing    Number of Occurrences:   1  . POCT rapid strep A [facility])    Standing Status:   Standing    Number of Occurrences:   1    Results for orders placed or performed during the hospital encounter of 11/03/18 (from the past 24 hour(s))  POCT rapid strep A [facility])     Status: None   Collection Time: 11/03/18  2:26 PM  Result Value Ref Range   Streptococcus, Group A Screen (Direct) NEGATIVE NEGATIVE   No results found.  ED Clinical Impression  1. Phlegmonous tonsillitis      ED Assessment/Plan  Concern for early peritonsillar abscess/phlegmon however there is no soft palate swelling or uvular deviation suggestive of peritonsillar abscess today.  Discussed with Dr. Suszanne Conners, ENT on-call.  He recommends dexamethasone 20 mg IM x1, clindamycin 300 4 times daily for 10 days.  Will also give her a gram of Rocephin.  Follow-up with him next week.  Gave her strict ER return precautions.  Also ibuprofen 600 mg combined with 1 g of Tylenol tid-qid.  Discussed , MDM, treatment plan, and plan for follow-up with patient. Discussed sn/sx  that should prompt return to the ED. patient agrees with plan.   Meds ordered this encounter  Medications  . cefTRIAXone (ROCEPHIN) injection 1 g  . DISCONTD: dexamethasone (DECADRON) injection 10 mg  . dexamethasone (DECADRON) injection 20 mg  . ibuprofen (ADVIL) 600 MG tablet    Sig: Take 1 tablet (600 mg total) by mouth every 6 (six) hours as needed.    Dispense:  30 tablet    Refill:  0  . clindamycin (CLEOCIN) 300 MG capsule    Sig: Take 1 capsule (300 mg total) by mouth 4 (four)  times daily for 10 days.    Dispense:  40 capsule    Refill:  0    *This clinic note was created using [employer]. Therefore, there may be occasional mistakes despite careful proofreading.   ?   Melynda Ripple, MD 11/03/18 1454    Melynda Ripple, MD 11/03/18 1456    Melynda Ripple, MD 11/04/18 [phone]

## 2018-11-05 LAB — NOVEL CORONAVIRUS, NAA (HOSP ORDER, SEND-OUT TO REF LAB; TAT 18-24 HRS): SARS-CoV-2, NAA: NOT DETECTED

## 2018-11-09 LAB — CULTURE, GROUP A STREP (THRC)

## 2019-12-25 ENCOUNTER — Ambulatory Visit: Payer: Self-pay | Attending: Family Medicine | Admitting: Family Medicine

## 2019-12-25 ENCOUNTER — Encounter: Payer: Self-pay | Admitting: Family Medicine

## 2019-12-25 ENCOUNTER — Other Ambulatory Visit: Payer: Self-pay

## 2019-12-25 VITALS — BP 95/60 | HR 92 | Ht 67.0 in | Wt 146.0 lb

## 2019-12-25 DIAGNOSIS — Z13228 Encounter for screening for other metabolic disorders: Secondary | ICD-10-CM

## 2019-12-25 DIAGNOSIS — Z23 Encounter for immunization: Secondary | ICD-10-CM

## 2019-12-25 DIAGNOSIS — F988 Other specified behavioral and emotional disorders with onset usually occurring in childhood and adolescence: Secondary | ICD-10-CM

## 2019-12-25 DIAGNOSIS — Z1159 Encounter for screening for other viral diseases: Secondary | ICD-10-CM

## 2019-12-25 DIAGNOSIS — F320 Major depressive disorder, single episode, mild: Secondary | ICD-10-CM

## 2019-12-25 NOTE — Progress Notes (Signed)
Was est with [facility] but due to no insurance they will not see her.  Wants to discuss medication for ADHD.

## 2019-12-25 NOTE — Progress Notes (Signed)
Subjective:  Patient ID: Erin Ayala, female    DOB: [birth year]  Age: 34 y.o. MRN: [id number]  CC: New Patient (Initial Visit)   HPI Erin Ayala is a 34 year old female who presents to establish care.  Previously followed at [facility] where she received Lamictal, Wellbutrin for Depression and also received Adderrall for ADHD (which she stopped because the dose was too high 6 months ago).  States she was never diagnosed with bipolar disorder.  She recently started working again and feels she needs to get back on Adderrall. [facility] had provided enough refills until her appointment and she has been compliant.   Past Medical History:  Diagnosis Date  . ADHD (attention deficit hyperactivity disorder)   . Asthma high school   Exercised induced  . Depression   . Fetal heart rate decelerations affecting management of mother 04/21/2012    Past Surgical History:  Procedure Laterality Date  . NO PAST SURGERIES      Family History  Problem Relation Age of Onset  . Seizures Mother   . Depression Father   . Depression Brother   . Anxiety disorder Sister     No Known Allergies  Outpatient Medications Prior to Visit  Medication Sig Dispense Refill  . buPROPion (WELLBUTRIN XL) 300 MG 24 hr tablet Take 300 mg by mouth daily.    Marland Kitchen lamoTRIgine (LAMICTAL) 100 MG tablet Take 100 mg by mouth daily.    Marland Kitchen amphetamine-dextroamphetamine (ADDERALL XR) 15 MG 24 hr capsule Take 15 mg by mouth daily. (Patient not taking: Reported on 12/25/2019)    . amphetamine-dextroamphetamine (ADDERALL) 5 MG tablet Take 5 mg by mouth daily as needed (to focus). (Patient not taking: Reported on 12/25/2019)    . ibuprofen (ADVIL) 600 MG tablet Take 1 tablet (600 mg total) by mouth every 6 (six) hours as needed. (Patient not taking: Reported on 12/25/2019) 30 tablet 0   No facility-administered medications prior to visit.     ROS Review of Systems  Constitutional: Negative for activity change, appetite  change and fatigue.  HENT: Negative for congestion, sinus pressure and sore throat.   Eyes: Negative for visual disturbance.  Respiratory: Negative for cough, chest tightness, shortness of breath and wheezing.   Cardiovascular: Negative for chest pain and palpitations.  Gastrointestinal: Negative for abdominal distention, abdominal pain and constipation.  Endocrine: Negative for polydipsia.  Genitourinary: Negative for dysuria and frequency.  Musculoskeletal: Negative for arthralgias and back pain.  Skin: Negative for rash.  Neurological: Negative for tremors, light-headedness and numbness.  Hematological: Does not bruise/bleed easily.  Psychiatric/Behavioral: Negative for agitation and behavioral problems.    Objective:  BP 95/60   Pulse 92   Ht 5' 7"  (1.702 m)   Wt 146 lb (66.2 kg)   SpO2 100%   BMI 22.87 kg/m   BP/Weight 12/25/2019 12/27/4495 05/27/49  Systolic BP 95 102 99  Diastolic BP 60 79 59  Wt. (Lbs) 146 130 -  BMI 22.87 20.36 -      Physical Exam Constitutional:      Appearance: She is well-developed.  Neck:     Vascular: No JVD.  Cardiovascular:     Rate and Rhythm: Normal rate.     Heart sounds: Normal heart sounds. No murmur heard.   Pulmonary:     Effort: Pulmonary effort is normal.     Breath sounds: Normal breath sounds. No wheezing or rales.  Chest:     Chest wall: No tenderness.  Abdominal:  General: Bowel sounds are normal. There is no distension.     Palpations: Abdomen is soft. There is no mass.     Tenderness: There is no abdominal tenderness.  Musculoskeletal:        General: Normal range of motion.     Right lower leg: No edema.     Left lower leg: No edema.  Neurological:     Mental Status: She is alert and oriented to person, place, and time.  Psychiatric:        Mood and Affect: Mood normal.     CMP Latest Ref Rng & Units 08/28/2016 11/14/2014  Glucose 65 - 99 mg/dL 99 98  BUN 6 - 20 mg/dL 11 10  Creatinine 0.44 - 1.00 mg/dL  0.84 0.64  Sodium 135 - 145 mmol/L 139 139  Potassium 3.5 - 5.1 mmol/L 3.7 4.0  Chloride 101 - 111 mmol/L 105 105  CO2 22 - 32 mmol/L 25 28  Calcium 8.9 - 10.3 mg/dL 9.4 9.1  Total Protein 6.5 - 8.1 g/dL - 7.9  Total Bilirubin 0.3 - 1.2 mg/dL - 2.4(H)  Alkaline Phos 38 - 126 U/L - 60  AST 15 - 41 U/L - 20  ALT 14 - 54 U/L - 18    Lipid Panel  No results found for: CHOL, TRIG, HDL, CHOLHDL, VLDL, LDLCALC, LDLDIRECT  CBC    Component Value Date/Time   WBC 8.5 08/28/2016 0004   RBC 4.42 08/28/2016 0004   HGB 12.8 08/28/2016 0004   HCT 39.5 08/28/2016 0004   PLT 179 08/28/2016 0004   MCV 89.4 08/28/2016 0004   MCH 29.0 08/28/2016 0004   MCHC 32.4 08/28/2016 0004   RDW 12.2 08/28/2016 0004   LYMPHSABS 1.6 10/08/2011 0939   MONOABS 0.6 10/08/2011 0939   EOSABS 0.3 10/08/2011 0939   BASOSABS 0.0 10/08/2011 0939      Assessment & Plan:  1. Attention deficit disorder, unspecified hyperactivity presence Previously on Adderall I have referred her to [facility] behavioral health - Ambulatory referral to Psychiatry  2. Current mild episode of major depressive disorder, unspecified whether recurrent (HCC) Stable Continue Lamictal and Wellbutrin  3. Need for hepatitis C screening test - HCV RNA quant rflx ultra or genotyp([facility])  4. Screening for metabolic disorder - Lipid panel - CMP14+EGFR - CBC with Differential/Platelet  5. Need for immunization against influenza - Flu Vaccine QUAD 36+ mos IM    No orders of the defined types were placed in this encounter.   Follow-up: Return in about 6 weeks (around 02/05/2020) for PAP smear.       Charlott Rakes, MD, FAAFP. [facility] and [place], [place]   12/25/2019, 9:18 AM

## 2019-12-25 NOTE — Patient Instructions (Signed)
It was great to meet you! Once I have reviewed your lab results, I will send you a message via MyChart. -Dr Alvis Lemmings.

## 2019-12-26 LAB — CMP14+EGFR
AST: 12 IU/L (ref 0–40)
CO2: 24 mmol/L (ref 20–29)
Globulin, Total: 2.3 g/dL (ref 1.5–4.5)

## 2019-12-26 LAB — LIPID PANEL
Chol/HDL Ratio: 2.9 ratio (ref 0.0–4.4)
LDL Chol Calc (NIH): 94 mg/dL (ref 0–99)
Triglycerides: 94 mg/dL (ref 0–149)

## 2019-12-26 LAB — CBC WITH DIFFERENTIAL/PLATELET
Immature Grans (Abs): 0 10*3/uL (ref 0.0–0.1)
Monocytes: 8 %
RBC: 4.11 x10E6/uL (ref 3.77–5.28)

## 2019-12-27 LAB — CBC WITH DIFFERENTIAL/PLATELET
Basophils Absolute: 0 10*3/uL (ref 0.0–0.2)
Basos: 1 %
EOS (ABSOLUTE): 0.2 10*3/uL (ref 0.0–0.4)
Eos: 3 %
Hematocrit: 37.6 % (ref 34.0–46.6)
Hemoglobin: 12.5 g/dL (ref 11.1–15.9)
Immature Granulocytes: 0 %
Lymphocytes Absolute: 1.5 10*3/uL (ref 0.7–3.1)
Lymphs: 29 %
MCH: 30.4 pg (ref 26.6–33.0)
MCHC: 33.2 g/dL (ref 31.5–35.7)
MCV: 92 fL (ref 79–97)
Monocytes Absolute: 0.4 10*3/uL (ref 0.1–0.9)
Neutrophils Absolute: 3.1 10*3/uL (ref 1.4–7.0)
Neutrophils: 59 %
Platelets: 221 10*3/uL (ref 150–450)
RDW: 11.4 % — ABNORMAL LOW (ref 11.7–15.4)
WBC: 5.3 10*3/uL (ref 3.4–10.8)

## 2019-12-27 LAB — CMP14+EGFR
ALT: 12 IU/L (ref 0–32)
Albumin/Globulin Ratio: 2 (ref 1.2–2.2)
Albumin: 4.7 g/dL (ref 3.8–4.8)
Alkaline Phosphatase: 67 IU/L (ref 44–121)
BUN/Creatinine Ratio: 15 (ref 9–23)
BUN: 12 mg/dL (ref 6–20)
Bilirubin Total: 0.4 mg/dL (ref 0.0–1.2)
Calcium: 9.6 mg/dL (ref 8.7–10.2)
Chloride: 101 mmol/L (ref 96–106)
Creatinine, Ser: 0.8 mg/dL (ref 0.57–1.00)
GFR calc Af Amer: 113 mL/min/{1.73_m2} (ref >59)
GFR calc non Af Amer: 98 mL/min/{1.73_m2} (ref >59)
Glucose: 75 mg/dL (ref 65–99)
Potassium: 4.5 mmol/L (ref 3.5–5.2)
Sodium: 142 mmol/L (ref 134–144)
Total Protein: 7 g/dL (ref 6.0–8.5)

## 2019-12-27 LAB — HCV RNA QUANT RFLX ULTRA OR GENOTYP: HCV Quant Baseline: NOT DETECTED IU/mL

## 2019-12-27 LAB — LIPID PANEL
Cholesterol, Total: 168 mg/dL (ref 100–199)
HDL: 57 mg/dL (ref >39)
VLDL Cholesterol Cal: 17 mg/dL (ref 5–40)

## 2020-02-07 ENCOUNTER — Ambulatory Visit: Payer: Medicaid Other

## 2020-02-14 ENCOUNTER — Telehealth (INDEPENDENT_AMBULATORY_CARE_PROVIDER_SITE_OTHER): Payer: No Payment, Other | Admitting: Psychiatry

## 2020-02-14 ENCOUNTER — Encounter (HOSPITAL_COMMUNITY): Payer: Self-pay | Admitting: Psychiatry

## 2020-02-14 ENCOUNTER — Other Ambulatory Visit: Payer: Self-pay

## 2020-02-14 DIAGNOSIS — F900 Attention-deficit hyperactivity disorder, predominantly inattentive type: Secondary | ICD-10-CM | POA: Diagnosis not present

## 2020-02-14 DIAGNOSIS — F330 Major depressive disorder, recurrent, mild: Secondary | ICD-10-CM | POA: Diagnosis not present

## 2020-02-14 DIAGNOSIS — F3130 Bipolar disorder, current episode depressed, mild or moderate severity, unspecified: Secondary | ICD-10-CM | POA: Diagnosis not present

## 2020-02-14 MED ORDER — LAMOTRIGINE 100 MG PO TABS: 100.0000 mg | ORAL_TABLET | Freq: Every day | ORAL | 2 refills | Status: DC

## 2020-02-14 MED ORDER — AMPHETAMINE-DEXTROAMPHETAMINE 10 MG PO TABS: 10.0000 mg | ORAL_TABLET | Freq: Every day | ORAL | 0 refills | Status: DC

## 2020-02-14 MED ORDER — BUPROPION HCL ER (XL) 300 MG PO TB24: 300.0000 mg | ORAL_TABLET | Freq: Every day | ORAL | 2 refills | Status: DC
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
## 2020-02-18 ENCOUNTER — Ambulatory Visit: Payer: Medicaid Other | Admitting: Family Medicine

## 2020-04-17 ENCOUNTER — Telehealth (HOSPITAL_COMMUNITY): Payer: Self-pay | Admitting: *Deleted

## 2020-04-17 NOTE — Telephone Encounter (Signed)
Call from patient seeking a renewal on her Adderall Rx. Per chart she should have been out of it the end of Feb. Will ask Dr Doyne Keel to call it in for her. She is to check with her pharmacy later today.

## 2020-04-21 ENCOUNTER — Other Ambulatory Visit (HOSPITAL_COMMUNITY): Payer: Self-pay | Admitting: Psychiatry

## 2020-04-21 DIAGNOSIS — F900 Attention-deficit hyperactivity disorder, predominantly inattentive type: Secondary | ICD-10-CM

## 2020-04-21 MED ORDER — AMPHETAMINE-DEXTROAMPHETAMINE 10 MG PO TABS: 10.0000 mg | ORAL_TABLET | Freq: Every day | ORAL | 0 refills | Status: DC

## 2020-05-13 ENCOUNTER — Other Ambulatory Visit (HOSPITAL_COMMUNITY): Payer: Self-pay | Admitting: Psychiatry

## 2020-05-13 DIAGNOSIS — F900 Attention-deficit hyperactivity disorder, predominantly inattentive type: Secondary | ICD-10-CM

## 2020-05-13 DIAGNOSIS — F330 Major depressive disorder, recurrent, mild: Secondary | ICD-10-CM

## 2020-05-13 DIAGNOSIS — F3130 Bipolar disorder, current episode depressed, mild or moderate severity, unspecified: Secondary | ICD-10-CM

## 2020-05-14 ENCOUNTER — Encounter (HOSPITAL_COMMUNITY): Payer: Self-pay | Admitting: Psychiatry

## 2020-05-14 ENCOUNTER — Other Ambulatory Visit: Payer: Self-pay

## 2020-05-14 ENCOUNTER — Telehealth (INDEPENDENT_AMBULATORY_CARE_PROVIDER_SITE_OTHER): Payer: No Payment, Other | Admitting: Psychiatry

## 2020-05-14 DIAGNOSIS — F330 Major depressive disorder, recurrent, mild: Secondary | ICD-10-CM

## 2020-05-14 DIAGNOSIS — F900 Attention-deficit hyperactivity disorder, predominantly inattentive type: Secondary | ICD-10-CM

## 2020-05-14 DIAGNOSIS — F3130 Bipolar disorder, current episode depressed, mild or moderate severity, unspecified: Secondary | ICD-10-CM | POA: Diagnosis not present

## 2020-05-14 MED ORDER — ATOMOXETINE HCL 40 MG PO CAPS: 40.0000 mg | ORAL_CAPSULE | Freq: Every day | ORAL | 2 refills | Status: DC

## 2020-05-14 MED ORDER — BUPROPION HCL ER (XL) 300 MG PO TB24: 1.0000 | ORAL_TABLET | Freq: Every day | ORAL | 2 refills | Status: DC

## 2020-05-14 MED ORDER — LAMOTRIGINE 100 MG PO TABS: 100.0000 mg | ORAL_TABLET | Freq: Every day | ORAL | 2 refills | Status: DC

## 2020-05-14 NOTE — Progress Notes (Signed)
BH MD/PA/NP OP Progress Note Virtual Visit via Video Note  I connected with Erin Ayala on 05/14/20 at  9:30 AM EDT by a video enabled telemedicine application and verified that I am speaking with the correct person using two identifiers.  Location: Patient: Home Provider: Clinic   I discussed the limitations of evaluation and management by telemedicine and the availability of in person appointments. The patient expressed understanding and agreed to proceed.  I provided 30 minutes of non-face-to-face time during this encounter.    05/14/2020 9:55 AM Erin Ayala  MRN:  [id number]  Chief Complaint: "I'm not loving the adderall. I feel like it causing me to feel unstable and crash."  HPI: 35 year old female seen today for follow up psychiatric evaluation. She has a psychiatric history of Bipolar disorder, depression, and ADHD. She is currently managed on Adderall XL 10 mg daily, Wellbutrin 300 mg daily, and Lamictal 100 daily. She notes her medications are somewhat effective in managing her psychiatric conditions.  Today she is well groomed, pleasant, cooperative, and engaged in conversation. She notes that overall she is doing well however she reports that she is not liking her Adderall.  She notes that it makes her feel unstable, irritable and notes that she crashes.  She informed provider that she takes it periodically instead of every day.  She notes that she prefers to try a nonstimulant at this time.  Provider endorsed understanding and discussed the different types of nonstimulants.    Today patient notes that her anxiety and depression continues to be minimal.  Provider conducted a GAD-7 and patient scored a 10, at her last visit she scored a 9.  Provider also conducted a PHQ-9 and patient scored an 11, at her last visit she scored a 12.  She notes that at times she worries about her relationships and not getting things accomplished because of her anxiety.  Patient notes that  she feels that her mood is stable on Lamictal however she endorses impulsive behaviors, distractibility, and irritability.  Today she denies SI/HI/VAH or paranoia.    Today patient notes that she does not want her Lamictal adjusted.  She does note that she wants to discontinue Adderall 10 mg.  She will start Strattera 40 mg daily to help manage symptoms of ADHD.  She will continue all other medication as prescribed.  No other concerns noted at this time.    Visit Diagnosis:    ICD-10-CM   1. Attention deficit hyperactivity disorder (ADHD), predominantly inattentive type  F90.0 buPROPion (WELLBUTRIN XL) 300 MG 24 hr tablet    atomoxetine (STRATTERA) 40 MG capsule  2. Mild episode of recurrent major depressive disorder (HCC)  F33.0 buPROPion (WELLBUTRIN XL) 300 MG 24 hr tablet  3. Bipolar I disorder, most recent episode depressed (HCC)  F31.30 lamoTRIgine (LAMICTAL) 100 MG tablet    Past Psychiatric History:  Bipolar disorder, depression, and ADHD  Past Medical History:  Past Medical History:  Diagnosis Date  . ADHD (attention deficit hyperactivity disorder)   . Asthma high school   Exercised induced  . Depression   . Fetal heart rate decelerations affecting management of mother 04/21/2012    Past Surgical History:  Procedure Laterality Date  . NO PAST SURGERIES      Family Psychiatric History:  Father ADHD and depression, Sister ADHD and anxiety, Paternal grandparents   Family History:  Family History  Problem Relation Age of Onset  . Seizures Mother   . Depression Father   .  Depression Brother   . Anxiety disorder Sister     Social History:  Social History   Socioeconomic History  . Marital status: Single    Spouse name: Not on file  . Number of children: Not on file  . Years of education: Not on file  . Highest education level: Not on file  Occupational History  . Occupation: Agricultural consultant    Comment: Barrister's clerk  Tobacco Use  . Smoking status: Never Smoker  .  Smokeless tobacco: Never Used  Substance and Sexual Activity  . Alcohol use: No    Comment: occasional  . Drug use: No  . Sexual activity: Yes    Birth control/protection: None  Other Topics Concern  . Not on file  Social History Narrative  . Not on file   Social Determinants of Health   Financial Resource Strain: Not on file  Food Insecurity: Not on file  Transportation Needs: Not on file  Physical Activity: Not on file  Stress: Not on file  Social Connections: Not on file    Allergies: No Known Allergies  Metabolic Disorder Labs: No results found for: HGBA1C, MPG No results found for: PROLACTIN Lab Results  Component Value Date   CHOL 168 12/25/2019   TRIG 94 12/25/2019   HDL 57 12/25/2019   CHOLHDL 2.9 12/25/2019   LDLCALC 94 12/25/2019   No results found for: TSH  Therapeutic Level Labs: No results found for: LITHIUM No results found for: VALPROATE No components found for:  CBMZ  Current Medications: Current Outpatient Medications  Medication Sig Dispense Refill  . atomoxetine (STRATTERA) 40 MG capsule Take 1 capsule (40 mg total) by mouth daily. 30 capsule 2  . buPROPion (WELLBUTRIN XL) 300 MG 24 hr tablet Take 1 tablet (300 mg total) by mouth daily. 30 tablet 2  . ibuprofen (ADVIL) 600 MG tablet Take 1 tablet (600 mg total) by mouth every 6 (six) hours as needed. (Patient not taking: Reported on 12/25/2019) 30 tablet 0  . lamoTRIgine (LAMICTAL) 100 MG tablet Take 1 tablet (100 mg total) by mouth daily. 30 tablet 2   No current facility-administered medications for this visit.     Musculoskeletal: Strength & Muscle Tone: Unable to assess due to telehealth visit Gait & Station: Unable to assess due to telehealth visit Patient leans: N/A  Psychiatric Specialty Exam: Review of Systems  unknown if currently breastfeeding.There is no height or weight on file to calculate BMI.  General Appearance: Well Groomed  Eye Contact:  Good  Speech:  Clear and  Coherent and Normal Rate  Volume:  Normal  Mood:  Euthymic and Notes she occasionally has anxiety and depression however reports she is able to cope with it  Affect:  Appropriate and Congruent  Thought Process:  Coherent, Goal Directed and Linear  Orientation:  Full (Time, Place, and Person)  Thought Content: Logical and Illogical   Suicidal Thoughts:  No  Homicidal Thoughts:  No  Memory:  Immediate;   Good Recent;   Good Remote;   Good  Judgement:  Good  Insight:  Good  Psychomotor Activity:  Normal  Concentration:  Concentration: Good and Attention Span: Good  Recall:  Good  Fund of Knowledge: Good  Language: Good  Akathisia:  No  Handed:  Right  AIMS (if indicated): Not done  Assets:  Communication Skills Desire for Improvement Financial Resources/Insurance Housing Social Support  ADL's:  Intact  Cognition: WNL  Sleep:  Good   Screenings: GAD-7   Flowsheet Row  Video Visit from 05/14/2020 in [facility] Video Visit from 02/14/2020 in [facility] Office Visit from 12/25/2019 in [facility]  Total GAD-7 Score 10 9 8     PHQ2-9   Flowsheet Row Video Visit from 05/14/2020 in [facility] Video Visit from 02/14/2020 in [facility] Office Visit from 12/25/2019 in [facility]  PHQ-2 Total Score 4 2 2   PHQ-9 Total Score 11 12 11     Flowsheet Row Video Visit from 05/14/2020 in [facility]  C-SSRS RISK CATEGORY No Risk       Assessment and Plan: Patient notes that she occasionally has anxiety and depression however reports that she is able to cope with it.  She notes that at times her mood fluctuates but inform writer that she does not want to adjust her Lamictal.  She does note that she dislikes Adderall and would like to try nonstimulant because Adderall makes her irritable and  causes her to crash.  She is agreeable to start Strattera 40 mg daily to help manage symptoms of ADHD.  She will continue all other medication as prescribed.  1. Attention deficit hyperactivity disorder (ADHD), predominantly inattentive type  Continue- buPROPion (WELLBUTRIN XL) 300 MG 24 hr tablet; Take 1 tablet (300 mg total) by mouth daily.  Dispense: 30 tablet; Refill: 2 Start- atomoxetine (STRATTERA) 40 MG capsule; Take 1 capsule (40 mg total) by mouth daily.  Dispense: 30 capsule; Refill: 2  2. Mild episode of recurrent major depressive disorder (HCC)  Continue- buPROPion (WELLBUTRIN XL) 300 MG 24 hr tablet; Take 1 tablet (300 mg total) by mouth daily.  Dispense: 30 tablet; Refill: 2  3. Bipolar I disorder, most recent episode depressed (HCC)  Continue- lamoTRIgine (LAMICTAL) 100 MG tablet; Take 1 tablet (100 mg total) by mouth daily.  Dispense: 30 tablet; Refill: 2  Follow-up in 3 months  , NP 05/14/2020, 9:55 AM

## 2020-08-07 ENCOUNTER — Other Ambulatory Visit (HOSPITAL_COMMUNITY): Payer: Self-pay | Admitting: Psychiatry

## 2020-08-07 DIAGNOSIS — F900 Attention-deficit hyperactivity disorder, predominantly inattentive type: Secondary | ICD-10-CM

## 2020-08-13 ENCOUNTER — Other Ambulatory Visit: Payer: Self-pay

## 2020-08-13 ENCOUNTER — Telehealth (INDEPENDENT_AMBULATORY_CARE_PROVIDER_SITE_OTHER): Payer: No Payment, Other | Admitting: Psychiatry

## 2020-08-13 ENCOUNTER — Encounter (HOSPITAL_COMMUNITY): Payer: Self-pay | Admitting: Psychiatry

## 2020-08-13 DIAGNOSIS — F3130 Bipolar disorder, current episode depressed, mild or moderate severity, unspecified: Secondary | ICD-10-CM | POA: Diagnosis not present

## 2020-08-13 DIAGNOSIS — F900 Attention-deficit hyperactivity disorder, predominantly inattentive type: Secondary | ICD-10-CM

## 2020-08-13 DIAGNOSIS — F330 Major depressive disorder, recurrent, mild: Secondary | ICD-10-CM

## 2020-08-13 MED ORDER — LAMOTRIGINE 100 MG PO TABS: 100.0000 mg | ORAL_TABLET | Freq: Every day | ORAL | 3 refills | Status: DC

## 2020-08-13 MED ORDER — ATOMOXETINE HCL 40 MG PO CAPS: 40.0000 mg | ORAL_CAPSULE | Freq: Every day | ORAL | 3 refills | Status: DC

## 2020-08-13 MED ORDER — BUPROPION HCL ER (XL) 300 MG PO TB24: 300.0000 mg | ORAL_TABLET | Freq: Every day | ORAL | 3 refills | Status: DC

## 2020-08-13 NOTE — Progress Notes (Signed)
BH MD/PA/NP OP Progress Note .Virtual Visit via Telephone Note  I connected with Erin Ayala on 08/13/20 at  9:30 AM EDT by telephone and verified that I am speaking with the correct person using two identifiers.  Location: Patient: home Provider: Clinic   I discussed the limitations, risks, security and privacy concerns of performing an evaluation and management service by telephone and the availability of in person appointments. I also discussed with the patient that there may be a patient responsible charge related to this service. The patient expressed understanding and agreed to proceed.   I provided 30 minutes of non-face-to-face time during this encounter.     08/13/2020 9:48 AM Erin Ayala  MRN:  [id number]  Chief Complaint: "Everything pretty much the same"  HPI: 35 year old female seen today for follow up psychiatric evaluation. She has a psychiatric history of Bipolar disorder, depression, and ADHD. She is currently managed on Adderall XL 10 mg daily, Wellbutrin 300 mg daily, and Lamictal 100 daily. She notes her medications are effective in managing her psychiatric conditions.   Today she is was unable to log in virtually so her exam was done over the phone. During exam she was pleasant, cooperative, and engaged in conversation. She informed Clinical research associate that since her last visit everything has been about the same. She notes overall her mood is stable. She notes at times she becomes irrigable but less irritable that she was when she was taking Adderall. She notes Strattera has been somewhat effective, however she reports at times she becomes overwhelmed because she is distractible and has difficulty completing task.   Today patient notes that her anxiety and depression continues to be minimal.  Provider conducted a GAD-7 and patient scored a 9, at her last visit she scored a 10.  Provider also conducted a PHQ-9 and patient scored an 11, at her last visit she scored a 11.  She  notes that at times she has intrusive thoughts about her son being harmed. Provider informed her that her intrusive thoughts could be a symptom of her depression, anxiety, or past trauma. She endorsed understanding and agreed. She notes that she has been discussing this in therapy. Patient also notes that a source of her stress deals with her boyfriend moving in. She reports that she is happy that he is there but her home is small. He son being out of school has also increased her stress.  Today she denies SI/HI/VAH or paranoia.    At this time patient request that her medications not be adjusted as she reports she is able to cope with life stressors. She will follow up with outpatient counseling for therapy. No other concerns noted at this time.    Visit Diagnosis:    ICD-10-CM   1. Attention deficit hyperactivity disorder (ADHD), predominantly inattentive type  F90.0 atomoxetine (STRATTERA) 40 MG capsule    buPROPion (WELLBUTRIN XL) 300 MG 24 hr tablet    2. Mild episode of recurrent major depressive disorder (HCC)  F33.0 buPROPion (WELLBUTRIN XL) 300 MG 24 hr tablet    3. Bipolar I disorder, most recent episode depressed (HCC)  F31.30 lamoTRIgine (LAMICTAL) 100 MG tablet      Past Psychiatric History:  Bipolar disorder, depression, and ADHD  Past Medical History:  Past Medical History:  Diagnosis Date   ADHD (attention deficit hyperactivity disorder)    Asthma high school   Exercised induced   Depression    Fetal heart rate decelerations affecting management of  mother 04/21/2012    Past Surgical History:  Procedure Laterality Date   NO PAST SURGERIES      Family Psychiatric History:  Father ADHD and depression, Sister ADHD and anxiety, Paternal grandparents   Family History:  Family History  Problem Relation Age of Onset   Seizures Mother    Depression Father    Depression Brother    Anxiety disorder Sister     Social History:  Social History   Socioeconomic History    Marital status: Single    Spouse name: Not on file   Number of children: Not on file   Years of education: Not on file   Highest education level: Not on file  Occupational History   Occupation: Agricultural consultant    Comment: Refugee volunteer  Tobacco Use   Smoking status: Never   Smokeless tobacco: Never  Substance and Sexual Activity   Alcohol use: No    Comment: occasional   Drug use: No   Sexual activity: Yes    Birth control/protection: None  Other Topics Concern   Not on file  Social History Narrative   Not on file   Social Determinants of Health   Financial Resource Strain: Not on file  Food Insecurity: Not on file  Transportation Needs: Not on file  Physical Activity: Not on file  Stress: Not on file  Social Connections: Not on file    Allergies: No Known Allergies  Metabolic Disorder Labs: No results found for: HGBA1C, MPG No results found for: PROLACTIN Lab Results  Component Value Date   CHOL 168 12/25/2019   TRIG 94 12/25/2019   HDL 57 12/25/2019   CHOLHDL 2.9 12/25/2019   LDLCALC 94 12/25/2019   No results found for: TSH  Therapeutic Level Labs: No results found for: LITHIUM No results found for: VALPROATE No components found for:  CBMZ  Current Medications: Current Outpatient Medications  Medication Sig Dispense Refill   atomoxetine (STRATTERA) 40 MG capsule Take 1 capsule (40 mg total) by mouth daily. 30 capsule 3   buPROPion (WELLBUTRIN XL) 300 MG 24 hr tablet Take 1 tablet (300 mg total) by mouth daily. 30 tablet 3   ibuprofen (ADVIL) 600 MG tablet Take 1 tablet (600 mg total) by mouth every 6 (six) hours as needed. (Patient not taking: Reported on 12/25/2019) 30 tablet 0   lamoTRIgine (LAMICTAL) 100 MG tablet Take 1 tablet (100 mg total) by mouth daily. 30 tablet 3   No current facility-administered medications for this visit.     Musculoskeletal: Strength & Muscle Tone:  Unable to assess due to telephone visit Gait & Station:  Unable to  assess due to telephone visit Patient leans: N/A  Psychiatric Specialty Exam: Review of Systems  unknown if currently breastfeeding.There is no height or weight on file to calculate BMI.  General Appearance:  Unable to assess due to telephone visit  Eye Contact:   Unable to assess due to telephone visit  Speech:  Clear and Coherent and Normal Rate  Volume:  Normal  Mood:  Euthymic and Notes she occasionally has anxiety and depression however reports she is able to cope with it  Affect:  Appropriate and Congruent  Thought Process:  Coherent, Goal Directed and Linear  Orientation:  Full (Time, Place, and Person)  Thought Content: WDL and Logical   Suicidal Thoughts:  No  Homicidal Thoughts:  No  Memory:  Immediate;   Good Recent;   Good Remote;   Good  Judgement:  Good  Insight:  Good  Psychomotor Activity:  Normal  Concentration:  Concentration: Good and Attention Span: Good  Recall:  Good  Fund of Knowledge: Good  Language: Good  Akathisia:  No  Handed:  Right  AIMS (if indicated): Not done  Assets:  Communication Skills Desire for Improvement Financial Resources/Insurance Housing Social Support  ADL's:  Intact  Cognition: WNL  Sleep:  Good   Screenings: GAD-7    Flowsheet Row Video Visit from 08/13/2020 in [facility] Video Visit from 05/14/2020 in [facility] Video Visit from 02/14/2020 in [facility] Office Visit from 12/25/2019 in [facility]  Total GAD-7 Score 9 10 9 8       PHQ2-9    Flowsheet Row Video Visit from 08/13/2020 in [facility] Video Visit from 05/14/2020 in [facility] Video Visit from 02/14/2020 in [facility] Office Visit from 12/25/2019 in [facility]  PHQ-2 Total Score 2 4 2 2   PHQ-9 Total Score 11 11 12 11        Flowsheet Row Video Visit from 05/14/2020 in [facility]  C-SSRS RISK CATEGORY No Risk        Assessment and Plan: Patient notes that she occasionally has anxiety and depression however reports that she is able to cope with it.  No medication changes made today. Patient agreeable to continue medications as prescribed.   1. Attention deficit hyperactivity disorder (ADHD), predominantly inattentive type  Continue- buPROPion (WELLBUTRIN XL) 300 MG 24 hr tablet; Take 1 tablet (300 mg total) by mouth daily.  Dispense: 30 tablet; Refill: 2 Continue- atomoxetine (STRATTERA) 40 MG capsule; Take 1 capsule (40 mg total) by mouth daily.  Dispense: 30 capsule; Refill: 2  2. Mild episode of recurrent major depressive disorder (HCC)  Continue- buPROPion (WELLBUTRIN XL) 300 MG 24 hr tablet; Take 1 tablet (300 mg total) by mouth daily.  Dispense: 30 tablet; Refill: 2  3. Bipolar I disorder, most recent episode depressed (HCC)  Continue- lamoTRIgine (LAMICTAL) 100 MG tablet; Take 1 tablet (100 mg total) by mouth daily.  Dispense: 30 tablet; Refill: 2  Follow-up in 3 months Follow up with thearpy  , NP 08/13/2020, 9:48 AM

## 2020-11-06 ENCOUNTER — Other Ambulatory Visit (HOSPITAL_COMMUNITY): Payer: Self-pay | Admitting: Psychiatry

## 2020-11-06 DIAGNOSIS — F3130 Bipolar disorder, current episode depressed, mild or moderate severity, unspecified: Secondary | ICD-10-CM

## 2020-11-18 ENCOUNTER — Telehealth (INDEPENDENT_AMBULATORY_CARE_PROVIDER_SITE_OTHER): Payer: No Payment, Other | Admitting: Psychiatry

## 2020-11-18 ENCOUNTER — Encounter (HOSPITAL_COMMUNITY): Payer: Self-pay | Admitting: Psychiatry

## 2020-11-18 DIAGNOSIS — F900 Attention-deficit hyperactivity disorder, predominantly inattentive type: Secondary | ICD-10-CM | POA: Diagnosis not present

## 2020-11-18 DIAGNOSIS — F3130 Bipolar disorder, current episode depressed, mild or moderate severity, unspecified: Secondary | ICD-10-CM | POA: Diagnosis not present

## 2020-11-18 DIAGNOSIS — F330 Major depressive disorder, recurrent, mild: Secondary | ICD-10-CM

## 2020-11-18 MED ORDER — AMPHETAMINE-DEXTROAMPHETAMINE 5 MG PO TABS: 5.0000 mg | ORAL_TABLET | Freq: Every day | ORAL | 0 refills | Status: DC

## 2020-11-18 MED ORDER — BUPROPION HCL ER (XL) 300 MG PO TB24: 300.0000 mg | ORAL_TABLET | Freq: Every day | ORAL | 3 refills | Status: DC

## 2020-11-18 MED ORDER — LAMOTRIGINE 100 MG PO TABS: 100.0000 mg | ORAL_TABLET | Freq: Every day | ORAL | 3 refills | Status: DC

## 2020-11-18 NOTE — Progress Notes (Signed)
BH MD/PA/NP OP Progress Note Virtual Visit via Video Note  I connected with Erin Ayala on 11/18/20 at  1:30 PM EDT by a video enabled telemedicine application and verified that I am speaking with the correct person using two identifiers.  Location: Patient: Home Provider: Clinic   I discussed the limitations of evaluation and management by telemedicine and the availability of in person appointments. The patient expressed understanding and agreed to proceed.  I provided 30 minutes of non-face-to-face time during this encounter.       08/13/2020 9:48 AM Erin Ayala  MRN:  [id number]  Chief Complaint: "I stopped Strattera a month ago because it made me feel dull and depressed"  HPI: 35 year old female seen today for follow up psychiatric evaluation. She has a psychiatric history of Bipolar disorder, depression, and ADHD. She is currently managed on Strattera 40 mg daily, Wellbutrin 300 mg daily, and Lamictal 100 daily. She notes her medications are somewhat effective in managing her psychiatric conditions.   Today she is was well-groomed, pleasant, cooperative, engaged in conversation, maintained eye contact. She informed Clinical research associate that since her last visit he discontinued Strattera because it made her feel dull and depressed.  She notes that since stopping her medication she has been feeling more depressed.  She informed Clinical research associate that Adderall was more effective in managing her ADHD however notes that it made her irritable at the 10 mg dose.  Provider discussed nonstimulant such as clonidine, Intuniv, and Qelbree.  Patient informed Clinical research associate that she has issues with low blood pressure.  Provider informed patient that clonidine and Intuniv may reduce blood pressure.  At this time patient does not have insurance and cannot afford to [place].    Patient informed Clinical research associate that her sleep has been disrupted since her partner left for [place] in August (he will return in Dec).  She notes that he  has been contracted to help train the [employer].  Patient informed writer that she sleeps 4 to 5 hours nightly.  She also notes that her appetite fluctuates but denies weight gain.  Today provider conducted a GAD-7 and patient scored an 11, at her last visit she scored a 9.  Provider also conducted PHQ-9 P scored a 15, at her last visit she scored an 11.  Today she denies SI/HI/VAH, mania, paranoia.  Patient informed Clinical research associate that she moved into a new house which has been helpful on her mental health.  Today patient is agreeable to starting Adderall 5 mg daily to help manage symptoms of ADHD.  At this time she does not want to restart Strattera.  She will continue all other medications as prescribed and notes that she will take them regularly.  She will follow-up with outpatient counseling for therapy.  No other concerns at this time.   Visit Diagnosis:    ICD-10-CM   1. Attention deficit hyperactivity disorder (ADHD), predominantly inattentive type  F90.0 atomoxetine (STRATTERA) 40 MG capsule    buPROPion (WELLBUTRIN XL) 300 MG 24 hr tablet    2. Mild episode of recurrent major depressive disorder (HCC)  F33.0 buPROPion (WELLBUTRIN XL) 300 MG 24 hr tablet    3. Bipolar I disorder, most recent episode depressed (HCC)  F31.30 lamoTRIgine (LAMICTAL) 100 MG tablet      Past Psychiatric History:  Bipolar disorder, depression, and ADHD  Past Medical History:  Past Medical History:  Diagnosis Date   ADHD (attention deficit hyperactivity disorder)    Asthma high school   Exercised  induced   Depression    Fetal heart rate decelerations affecting management of mother 04/21/2012    Past Surgical History:  Procedure Laterality Date   NO PAST SURGERIES      Family Psychiatric History:  Father ADHD and depression, Sister ADHD and anxiety, Paternal grandparents   Family History:  Family History  Problem Relation Age of Onset   Seizures Mother    Depression Father    Depression  Brother    Anxiety disorder Sister     Social History:  Social History   Socioeconomic History   Marital status: Single    Spouse name: Not on file   Number of children: Not on file   Years of education: Not on file   Highest education level: Not on file  Occupational History   Occupation: Agricultural consultant    Comment: Refugee volunteer  Tobacco Use   Smoking status: Never   Smokeless tobacco: Never  Substance and Sexual Activity   Alcohol use: No    Comment: occasional   Drug use: No   Sexual activity: Yes    Birth control/protection: None  Other Topics Concern   Not on file  Social History Narrative   Not on file   Social Determinants of Health   Financial Resource Strain: Not on file  Food Insecurity: Not on file  Transportation Needs: Not on file  Physical Activity: Not on file  Stress: Not on file  Social Connections: Not on file    Allergies: No Known Allergies  Metabolic Disorder Labs: No results found for: HGBA1C, MPG No results found for: PROLACTIN Lab Results  Component Value Date   CHOL 168 12/25/2019   TRIG 94 12/25/2019   HDL 57 12/25/2019   CHOLHDL 2.9 12/25/2019   LDLCALC 94 12/25/2019   No results found for: TSH  Therapeutic Level Labs: No results found for: LITHIUM No results found for: VALPROATE No components found for:  CBMZ  Current Medications: Current Outpatient Medications  Medication Sig Dispense Refill   atomoxetine (STRATTERA) 40 MG capsule Take 1 capsule (40 mg total) by mouth daily. 30 capsule 3   buPROPion (WELLBUTRIN XL) 300 MG 24 hr tablet Take 1 tablet (300 mg total) by mouth daily. 30 tablet 3   ibuprofen (ADVIL) 600 MG tablet Take 1 tablet (600 mg total) by mouth every 6 (six) hours as needed. (Patient not taking: Reported on 12/25/2019) 30 tablet 0   lamoTRIgine (LAMICTAL) 100 MG tablet Take 1 tablet (100 mg total) by mouth daily. 30 tablet 3   No current facility-administered medications for this visit.      Musculoskeletal: Strength & Muscle Tone:  Unable to assess due to telehealth visit Gait & Station:  Unable to assess due to telehealthvisit Patient leans: N/A  Psychiatric Specialty Exam: Review of Systems  unknown if currently breastfeeding.There is no height or weight on file to calculate BMI.  General Appearance: Well Groomed  Eye Contact:  Good  Speech:  Clear and Coherent and Normal Rate  Volume:  Normal  Mood:  Euthymic and Notes she occasionally has anxiety and depression however reports she is able to cope with it  Affect:  Appropriate and Congruent  Thought Process:  Coherent, Goal Directed and Linear  Orientation:  Full (Time, Place, and Person)  Thought Content: WDL and Logical   Suicidal Thoughts:  No  Homicidal Thoughts:  No  Memory:  Immediate;   Good Recent;   Good Remote;   Good  Judgement:  Good  Insight:  Good  Psychomotor Activity:  Normal  Concentration:  Concentration: Good and Attention Span: Good  Recall:  Good  Fund of Knowledge: Good  Language: Good  Akathisia:  No  Handed:  Right  AIMS (if indicated): Not done  Assets:  Communication Skills Desire for Improvement Financial Resources/Insurance Housing Social Support  ADL's:  Intact  Cognition: WNL  Sleep:  Fair   Screenings: GAD-7    Flowsheet Row Video Visit from 08/13/2020 in [facility] Video Visit from 05/14/2020 in [facility] Video Visit from 02/14/2020 in [facility] Office Visit from 12/25/2019 in [facility]  Total GAD-7 Score 9 10 9 8       PHQ2-9    Flowsheet Row Video Visit from 08/13/2020 in [facility] Video Visit from 05/14/2020 in [facility] Video Visit from 02/14/2020 in [facility] Office Visit from 12/25/2019 in [facility]  PHQ-2 Total  Score 2 4 2 2   PHQ-9 Total Score 11 11 12 11       Flowsheet Row Video Visit from 05/14/2020 in [facility]  C-SSRS RISK CATEGORY No Risk        Assessment and Plan: Patient notes that she occasionally has anxiety and depression due to life stressors.  She notes that she however has issues with ADHD and has discontinued Strattera over a month ago. Today patient is agreeable to starting Adderall 5 mg daily to help manage symptoms of ADHD.   She will continue all other medications as prescribed and notes that she will take them regularly.  1. Attention deficit hyperactivity disorder (ADHD), predominantly inattentive type  Start- amphetamine-dextroamphetamine (ADDERALL) 5 MG tablet; Take 1 tablet (5 mg total) by mouth daily.  Dispense: 30 tablet; Refill: 0 Continue- buPROPion (WELLBUTRIN XL) 300 MG 24 hr tablet; Take 1 tablet (300 mg total) by mouth daily.  Dispense: 30 tablet; Refill: 3  2. Mild episode of recurrent major depressive disorder (HCC)  Continue- buPROPion (WELLBUTRIN XL) 300 MG 24 hr tablet; Take 1 tablet (300 mg total) by mouth daily.  Dispense: 30 tablet; Refill: 3  3. Bipolar I disorder, most recent episode depressed (HCC)  Continue- lamoTRIgine (LAMICTAL) 100 MG tablet; Take 1 tablet (100 mg total) by mouth daily.  Dispense: 90 tablet; Refill: 3   Follow-up in 3 months Follow up with thearpy  05/16/2020, NP 08/13/2020, 9:48 AM

## 2020-12-16 ENCOUNTER — Other Ambulatory Visit (HOSPITAL_COMMUNITY): Payer: Self-pay | Admitting: Psychiatry

## 2020-12-16 ENCOUNTER — Telehealth (HOSPITAL_COMMUNITY): Payer: Self-pay | Admitting: Psychiatry

## 2020-12-16 DIAGNOSIS — F900 Attention-deficit hyperactivity disorder, predominantly inattentive type: Secondary | ICD-10-CM

## 2020-12-16 MED ORDER — AMPHETAMINE-DEXTROAMPHETAMINE 5 MG PO TABS: 5.0000 mg | ORAL_TABLET | Freq: Every day | ORAL | 0 refills | Status: DC

## 2020-12-16 NOTE — Telephone Encounter (Signed)
Patient called for REFILL:   Pharmacy :  [facility] [address] Patient phone [phone] Last seen:  11/18/20 COMMENTS:   RX LAST WRITTEN: 11/18/20 Next appt 02/12/21

## 2020-12-16 NOTE — Telephone Encounter (Signed)
Medication refilled and sent to preferred pharmacy

## 2021-01-15 ENCOUNTER — Other Ambulatory Visit (HOSPITAL_COMMUNITY): Payer: Self-pay | Admitting: Psychiatry

## 2021-01-15 ENCOUNTER — Telehealth (HOSPITAL_COMMUNITY): Payer: Self-pay | Admitting: *Deleted

## 2021-01-15 DIAGNOSIS — F900 Attention-deficit hyperactivity disorder, predominantly inattentive type: Secondary | ICD-10-CM

## 2021-01-15 MED ORDER — AMPHETAMINE-DEXTROAMPHETAMINE 5 MG PO TABS: 5.0000 mg | ORAL_TABLET | Freq: Every day | ORAL | 0 refills | Status: DC

## 2021-01-15 NOTE — Telephone Encounter (Signed)
VM from patient to have her Adderall refilled. She should be out. Last rx written on 11/22, and her next appt with Dr Doyne Keel in 1/19. Will notify Dr Doyne Keel of request.

## 2021-01-15 NOTE — Telephone Encounter (Signed)
Medication refilled and sent to preferred pharmacy

## 2021-02-12 ENCOUNTER — Encounter (HOSPITAL_COMMUNITY): Payer: Self-pay | Admitting: Psychiatry

## 2021-02-12 ENCOUNTER — Telehealth (INDEPENDENT_AMBULATORY_CARE_PROVIDER_SITE_OTHER): Payer: No Payment, Other | Admitting: Psychiatry

## 2021-02-12 DIAGNOSIS — F900 Attention-deficit hyperactivity disorder, predominantly inattentive type: Secondary | ICD-10-CM | POA: Diagnosis not present

## 2021-02-12 DIAGNOSIS — F3130 Bipolar disorder, current episode depressed, mild or moderate severity, unspecified: Secondary | ICD-10-CM | POA: Diagnosis not present

## 2021-02-12 DIAGNOSIS — F330 Major depressive disorder, recurrent, mild: Secondary | ICD-10-CM

## 2021-02-12 MED ORDER — LAMOTRIGINE 100 MG PO TABS: 100.0000 mg | ORAL_TABLET | Freq: Every day | ORAL | 3 refills | Status: DC

## 2021-02-12 MED ORDER — BUPROPION HCL ER (XL) 300 MG PO TB24: 300.0000 mg | ORAL_TABLET | Freq: Every day | ORAL | 3 refills | Status: DC

## 2021-02-12 MED ORDER — AMPHETAMINE-DEXTROAMPHETAMINE 5 MG PO TABS: 5.0000 mg | ORAL_TABLET | Freq: Every day | ORAL | 0 refills | Status: DC

## 2021-02-12 NOTE — Progress Notes (Signed)
BH MD/PA/NP OP Progress Note Virtual Visit via Video Note  I connected with Erin Ayala on 02/12/21 at  1:30 PM EST by a videEstill Bakeso enabled telemedicine application and verified that I am speaking with the correct person using two identifiers.  Location: Patient: Home Provider: Clinic   I discussed the limitations of evaluation and management by telemedicine and the availability of in person appointments. The patient expressed understanding and agreed to proceed.  I provided 30 minutes of non-face-to-face time during this encounter.       02/12/2021 1:42 PM Estill Bakesabitha J Strohmeier  MRN:  [id number]  Chief Complaint: "I am pretty stable"  HPI: 36 year old female seen today for follow up psychiatric evaluation. She has a psychiatric history of Bipolar disorder, depression, and ADHD. She is currently managed on Adderall 5  mg daily, Wellbutrin 300 mg daily, and Lamictal 100 daily. She notes her medications are effective in managing her psychiatric conditions.   Today she is was well-groomed, pleasant, cooperative, engaged in conversation, maintained eye contact. She informed Clinical research associatewriter that overall she feels stable.  She reports adding Adderall has helped her concentrate more.  At times patient notes that she worries about dealing with other people and adjusting to new environments.  Recently she notes that she started a roller derby team and is getting use to her peers.  Since the last visit she notes that her partner came back from [place] however will be leaving early next month.  She reports that her son is doing well.  Today patient notes her anxiety and depression are minimal.  Provider conducted a GAD-7 and patient scored an 11, at her last visit she scored a 11.  Provider also conducted PHQ-9 and patient scored 11, at her last visit she scored 15.  She notes she continues to sleep 5 hours nightly per reports that she is able to cope with it.  She endorses having an adequate appetite and a sweet  tooth (noting that she has been consuming more sugar).  She however denies weight gain. Today she denies SI/HI/VAH, mania, paranoia.  No medication changes made today.  Patient agreeable to continue medications as prescribed.  No other concerns at this time.    Visit Diagnosis:    ICD-10-CM   1. Attention deficit hyperactivity disorder (ADHD), predominantly inattentive type  F90.0 amphetamine-dextroamphetamine (ADDERALL) 5 MG tablet    buPROPion (WELLBUTRIN XL) 300 MG 24 hr tablet    2. Mild episode of recurrent major depressive disorder (HCC)  F33.0 buPROPion (WELLBUTRIN XL) 300 MG 24 hr tablet    3. Bipolar I disorder, most recent episode depressed (HCC)  F31.30 lamoTRIgine (LAMICTAL) 100 MG tablet      Past Psychiatric History:  Bipolar disorder, depression, and ADHD  Past Medical History:  Past Medical History:  Diagnosis Date   ADHD (attention deficit hyperactivity disorder)    Asthma high school   Exercised induced   Depression    Fetal heart rate decelerations affecting management of mother 04/21/2012    Past Surgical History:  Procedure Laterality Date   NO PAST SURGERIES      Family Psychiatric History:  Father ADHD and depression, Sister ADHD and anxiety, Paternal grandparents   Family History:  Family History  Problem Relation Age of Onset   Seizures Mother    Depression Father    Depression Brother    Anxiety disorder Sister     Social History:  Social History   Socioeconomic History   Marital status: Single  Spouse name: Not on file   Number of children: Not on file   Years of education: Not on file   Highest education level: Not on file  Occupational History   Occupation: Agricultural consultant    Comment: Refugee volunteer  Tobacco Use   Smoking status: Never   Smokeless tobacco: Never  Substance and Sexual Activity   Alcohol use: No    Comment: occasional   Drug use: No   Sexual activity: Yes    Birth control/protection: None  Other Topics Concern    Not on file  Social History Narrative   Not on file   Social Determinants of Health   Financial Resource Strain: Not on file  Food Insecurity: Not on file  Transportation Needs: Not on file  Physical Activity: Not on file  Stress: Not on file  Social Connections: Not on file    Allergies: No Known Allergies  Metabolic Disorder Labs: No results found for: HGBA1C, MPG No results found for: PROLACTIN Lab Results  Component Value Date   CHOL 168 12/25/2019   TRIG 94 12/25/2019   HDL 57 12/25/2019   CHOLHDL 2.9 12/25/2019   LDLCALC 94 12/25/2019   No results found for: TSH  Therapeutic Level Labs: No results found for: LITHIUM No results found for: VALPROATE No components found for:  CBMZ  Current Medications: Current Outpatient Medications  Medication Sig Dispense Refill   amphetamine-dextroamphetamine (ADDERALL) 5 MG tablet Take 1 tablet (5 mg total) by mouth daily. 30 tablet 0   buPROPion (WELLBUTRIN XL) 300 MG 24 hr tablet Take 1 tablet (300 mg total) by mouth daily. 30 tablet 3   ibuprofen (ADVIL) 600 MG tablet Take 1 tablet (600 mg total) by mouth every 6 (six) hours as needed. (Patient not taking: Reported on 12/25/2019) 30 tablet 0   lamoTRIgine (LAMICTAL) 100 MG tablet Take 1 tablet (100 mg total) by mouth daily. 90 tablet 3   No current facility-administered medications for this visit.     Musculoskeletal: Strength & Muscle Tone:  Unable to assess due to telehealth visit Gait & Station:  Unable to assess due to telehealthvisit Patient leans: N/A  Psychiatric Specialty Exam: Review of Systems  unknown if currently breastfeeding.There is no height or weight on file to calculate BMI.  General Appearance: Well Groomed  Eye Contact:  Good  Speech:  Clear and Coherent and Normal Rate  Volume:  Normal  Mood:  Euthymic  Affect:  Appropriate and Congruent  Thought Process:  Coherent, Goal Directed and Linear  Orientation:  Full (Time, Place, and Person)   Thought Content: WDL and Logical   Suicidal Thoughts:  No  Homicidal Thoughts:  No  Memory:  Immediate;   Good Recent;   Good Remote;   Good  Judgement:  Good  Insight:  Good  Psychomotor Activity:  Normal  Concentration:  Concentration: Good and Attention Span: Good  Recall:  Good  Fund of Knowledge: Good  Language: Good  Akathisia:  No  Handed:  Right  AIMS (if indicated): Not done  Assets:  Communication Skills Desire for Improvement Financial Resources/Insurance Housing Social Support  ADL's:  Intact  Cognition: WNL  Sleep:  Fair   Screenings: GAD-7    Flowsheet Row Video Visit from 02/12/2021 in [facility] Video Visit from 11/18/2020 in [facility] Video Visit from 08/13/2020 in [facility] Video Visit from 05[facility]14/2020 in [facility] Video Visit from 02/14/2020 in [facility]  Total GAD-7 Score 11 11 9 10 9       PHQ2-9    Flowsheet Row Video Visit from 02/12/2021 in [facility] Video Visit from 11/18/2020 in [facility] Video Visit from 08/13/2020 in [facility] Video Visit from 05/14/2020 in [facility] Video Visit from 02/14/2020 in [facility]  PHQ-2 Total Score 2 4 2 4 2   PHQ-9 Total Score 11 15 11 11 12       Flowsheet Row Video Visit from 05/14/2020 in [facility]  C-SSRS RISK CATEGORY No Risk        Assessment and Plan: Patient notes that she is doing well on her current medication regimen.  No medication changes made today.  Patient agreeable to continue medications as prescribed. 1. Attention deficit hyperactivity disorder (ADHD), predominantly inattentive type  Continue- amphetamine-dextroamphetamine (ADDERALL) 5 MG tablet; Take 1 tablet (5 mg  total) by mouth daily.  Dispense: 30 tablet; Refill: 0 Continue- buPROPion (WELLBUTRIN XL) 300 MG 24 hr tablet; Take 1 tablet (300 mg total) by mouth daily.  Dispense: 30 tablet; Refill: 3  2. Mild episode of recurrent major depressive disorder (HCC)  Continue- buPROPion (WELLBUTRIN XL) 300 MG 24 hr tablet; Take 1 tablet (300 mg total) by mouth daily.  Dispense: 30 tablet; Refill: 3  3. Bipolar I disorder, most recent episode depressed (HCC)  Continue- lamoTRIgine (LAMICTAL) 100 MG tablet; Take 1 tablet (100 mg total) by mouth daily.  Dispense: 90 tablet; Refill: 3   Follow-up in 3 months Follow up with thearpy  , NP 02/12/2021, 1:42 PM

## 2021-03-21 ENCOUNTER — Ambulatory Visit (HOSPITAL_COMMUNITY)
Admission: EM | Admit: 2021-03-21 | Discharge: 2021-03-21 | Disposition: A | Discharge status: Home / Self Care | Payer: 59 | Attending: Nurse Practitioner | Admitting: Nurse Practitioner

## 2021-03-21 ENCOUNTER — Other Ambulatory Visit: Payer: Self-pay

## 2021-03-21 ENCOUNTER — Encounter (HOSPITAL_COMMUNITY): Payer: Self-pay

## 2021-03-21 DIAGNOSIS — H66002 Acute suppurative otitis media without spontaneous rupture of ear drum, left ear: Secondary | ICD-10-CM

## 2021-03-21 MED ORDER — AMOXICILLIN 875 MG PO TABS: 875.0000 mg | ORAL_TABLET | Freq: Two times a day (BID) | ORAL | 0 refills | Status: AC

## 2021-03-21 NOTE — ED Provider Notes (Signed)
[facility]    CSN: [id number] Arrival date & time: 03/21/21  1541      History   Chief Complaint Chief Complaint  Patient presents with   Otalgia    HPI Erin Ayala is a 36 y.o. female.   Patient reports 1 day history of left ear pain.  She reports she woke up this morning with severe pain on left side of her face and her left ear.  She denies fevers, bodyaches, chills, drainage from the ear.  She does endorse some postnasal drip and slight sore throat.  She tried using some over-the-counter earache drops, which made the pain worse initially, however the pain has decreased now.  She reports she did have an ear infection about 15 years ago.     Past Medical History:  Diagnosis Date   ADHD (attention deficit hyperactivity disorder)    Asthma high school   Exercised induced   Depression    Fetal heart rate decelerations affecting management of mother 04/21/2012    Patient Active Problem List   Diagnosis Date Noted   Bipolar I disorder, most recent episode depressed ([place]) 02/14/2020   Vacuum extractor delivery, delivered 04/22/2012   Asthma 10/12/2011   ADD (attention deficit disorder) 10/12/2011   Depression 10/12/2011   Irregular periods/menstrual cycles 10/12/2011    Past Surgical History:  Procedure Laterality Date   NO PAST SURGERIES      OB History     Gravida  1   Para  1   Term  1   Preterm      AB      Living  1      SAB      IAB      Ectopic      Multiple      Live Births  1            Home Medications    Prior to Admission medications   Medication Sig Start Date End Date Taking? Authorizing Provider  amoxicillin (AMOXIL) 875 MG tablet Take 1 tablet (875 mg total) by mouth 2 (two) times daily for 5 days. 03/21/21 03/26/21 Yes Noemi Chapel A, NP  amphetamine-dextroamphetamine (ADDERALL) 5 MG tablet Take 1 tablet (5 mg total) by mouth daily. 02/12/21   Salley Slaughter, NP  buPROPion (WELLBUTRIN XL) 300 MG  24 hr tablet Take 1 tablet (300 mg total) by mouth daily. 02/12/21   Salley Slaughter, NP  ibuprofen (ADVIL) 600 MG tablet Take 1 tablet (600 mg total) by mouth every 6 (six) hours as needed. Patient not taking: Reported on 12/25/2019 11/03/18   Melynda Ripple, MD  lamoTRIgine (LAMICTAL) 100 MG tablet Take 1 tablet (100 mg total) by mouth daily. 02/12/21   Salley Slaughter, NP    Family History Family History  Problem Relation Age of Onset   Seizures Mother    Depression Father    Depression Brother    Anxiety disorder Sister     Social History Social History   Tobacco Use   Smoking status: Never   Smokeless tobacco: Never  Substance Use Topics   Alcohol use: No    Comment: occasional   Drug use: No     Allergies   Patient has no known allergies.   Review of Systems Review of Systems Per HPI  Physical Exam Triage Vital Signs ED Triage Vitals  Enc Vitals Group     BP 03/21/21 1654 111/71     Pulse Rate 03/21/21 1654  96     Resp 03/21/21 1654 16     Temp 03/21/21 1654 99.4 F (37.4 C)     Temp Source 03/21/21 1654 Oral     SpO2 03/21/21 1654 100 %     Weight --      Height --      Head Circumference --      Peak Flow --      Pain Score 03/21/21 1655 0     Pain Loc --      Pain Edu? --      Excl. in [place]? --    No data found.  Updated Vital Signs BP 111/71 (BP Location: Left Arm)    Pulse 96    Temp 99.4 F (37.4 C) (Oral)    Resp 16    LMP 03/11/2021 (Approximate)    SpO2 100%   Visual Acuity Right Eye Distance:   Left Eye Distance:   Bilateral Distance:    Right Eye Near:   Left Eye Near:    Bilateral Near:     Physical Exam Vitals and nursing note reviewed.  Constitutional:      General: She is not in acute distress.    Appearance: Normal appearance. She is not toxic-appearing.  HENT:     Head: Normocephalic and atraumatic.     Right Ear: Ear canal and external ear normal. No drainage or swelling. Tympanic membrane is erythematous and  bulging. Tympanic membrane is not perforated.     Left Ear: There is no impacted cerumen.     Nose: Nose normal. No congestion.     Mouth/Throat:     Mouth: Mucous membranes are moist.     Pharynx: Oropharynx is clear. Posterior oropharyngeal erythema present.  Eyes:     General: No scleral icterus.    Extraocular Movements: Extraocular movements intact.  Cardiovascular:     Rate and Rhythm: Normal rate and regular rhythm.  Pulmonary:     Effort: Pulmonary effort is normal. No respiratory distress.     Breath sounds: Normal breath sounds. No wheezing or rales.  Musculoskeletal:     Cervical back: Normal range of motion.  Lymphadenopathy:     Cervical: No cervical adenopathy.  Skin:    General: Skin is warm and dry.     Coloration: Skin is not jaundiced or pale.     Findings: No erythema.  Neurological:     Mental Status: She is alert and oriented to person, place, and time.    UC Treatments / Results  Labs (all labs ordered are listed, but only abnormal results are displayed) Labs Reviewed - No data to display  EKG   Radiology No results found.  Procedures Procedures (including critical care time)  Medications Ordered in UC Medications - No data to display  Initial Impression / Assessment and Plan / UC Course  I have reviewed the triage vital signs and the nursing notes.  Pertinent labs & imaging results that were available during my care of the patient were reviewed by me and considered in my medical decision making (see chart for details).    Symptoms and exam consistent with acute otitis media of left ear.  Treat with amoxicillin twice daily for 5 days.  Can use Tylenol and/or ibuprofen alternating for pain relief.  Return if symptoms worsen or persist. Final Clinical Impressions(s) / UC Diagnoses   Final diagnoses:  Non-recurrent acute suppurative otitis media of left ear without spontaneous rupture of tympanic membrane  Discharge Instructions       Please take the amoxicillin 875 mg twice daily for 5 days.  This should take care of the ear infection.  You can use Tylenol and/or ibuprofen alternating to help with the pain in the left ear.  If you notice any yellow or green drainage from the left ear, please come back to see [place].  If your symptoms do not improve after antibiotics, please also can back to see [place].     ED Prescriptions     Medication Sig Dispense Auth. Provider   amoxicillin (AMOXIL) 875 MG tablet Take 1 tablet (875 mg total) by mouth 2 (two) times daily for 5 days. 10 tablet Eulogio Bear, NP      PDMP not reviewed this encounter.   Eulogio Bear, NP 03/21/21 [phone]

## 2021-03-21 NOTE — Discharge Instructions (Signed)
Please take the amoxicillin 875 mg twice daily for 5 days.  This should take care of the ear infection.  You can use Tylenol and/or ibuprofen alternating to help with the pain in the left ear.  If you notice any yellow or green drainage from the left ear, please come back to see [place].  If your symptoms do not improve after antibiotics, please also can back to see [place].

## 2021-03-21 NOTE — ED Triage Notes (Signed)
Pt presents to urgent care for ear pain x 2-3 days. She does reports some post-natal drip.

## 2021-03-24 ENCOUNTER — Encounter (HOSPITAL_COMMUNITY): Payer: Self-pay

## 2021-04-02 ENCOUNTER — Telehealth (HOSPITAL_COMMUNITY): Payer: Self-pay | Admitting: *Deleted

## 2021-04-02 ENCOUNTER — Other Ambulatory Visit (HOSPITAL_COMMUNITY): Payer: Self-pay | Admitting: Physician Assistant

## 2021-04-02 ENCOUNTER — Other Ambulatory Visit (HOSPITAL_COMMUNITY): Payer: Self-pay | Admitting: Registered Nurse

## 2021-04-02 DIAGNOSIS — F900 Attention-deficit hyperactivity disorder, predominantly inattentive type: Secondary | ICD-10-CM

## 2021-04-02 MED ORDER — AMPHETAMINE-DEXTROAMPHETAMINE 5 MG PO TABS: 5.0000 mg | ORAL_TABLET | Freq: Every day | ORAL | 0 refills | Status: DC

## 2021-04-02 NOTE — Progress Notes (Signed)
Patient's medication e-prescribed to pharmacy of choice. 

## 2021-04-02 NOTE — Telephone Encounter (Signed)
Patient called seeking new rx for her adderall. She was asked to call her pharmacy to make sure they had a supply for her before we escribed it in. She called back to say they had # 40. In Dr Ronne Binning absence Delphia Grates NP will escribe it in for her.  ?

## 2021-04-28 ENCOUNTER — Telehealth (INDEPENDENT_AMBULATORY_CARE_PROVIDER_SITE_OTHER): Payer: 59 | Admitting: Psychiatry

## 2021-04-28 DIAGNOSIS — F3130 Bipolar disorder, current episode depressed, mild or moderate severity, unspecified: Secondary | ICD-10-CM

## 2021-04-28 DIAGNOSIS — F330 Major depressive disorder, recurrent, mild: Secondary | ICD-10-CM

## 2021-04-28 DIAGNOSIS — F900 Attention-deficit hyperactivity disorder, predominantly inattentive type: Secondary | ICD-10-CM | POA: Diagnosis not present

## 2021-04-28 MED ORDER — AMPHETAMINE-DEXTROAMPHETAMINE 5 MG PO TABS: 5.0000 mg | ORAL_TABLET | Freq: Every day | ORAL | 0 refills | Status: DC

## 2021-04-28 MED ORDER — LAMOTRIGINE 100 MG PO TABS: 100.0000 mg | ORAL_TABLET | Freq: Every day | ORAL | 3 refills | Status: DC

## 2021-04-28 MED ORDER — BUPROPION HCL ER (XL) 300 MG PO TB24: 300.0000 mg | ORAL_TABLET | Freq: Every day | ORAL | 3 refills | Status: DC

## 2021-04-28 NOTE — Progress Notes (Signed)
BH MD/PA/NP OP Progress Note ? ?04/28/2021 3:23 PM ?Erin Ayala  ?MRN:  [id number] ? ?Virtual Visit via Video Note ? ?I connected with Erin Ayala on 04/28/21 at  3:00 PM EDT by a video enabled telemedicine application and verified that I am speaking with the correct person using two identifiers. ? ?Location: ?Patient: home ?Provider: offsite ?  ?I discussed the limitations of evaluation and management by telemedicine and the availability of in person appointments. The patient expressed understanding and agreed to proceed. ? ?  ?I discussed the assessment and treatment plan with the patient. The patient was provided an opportunity to ask questions and all were answered. The patient agreed with the plan and demonstrated an understanding of the instructions. ?  ?The patient was advised to call back or seek an in-person evaluation if the symptoms worsen or if the condition fails to improve as anticipated. ? ?I provided 5 minutes of non-face-to-face time during this encounter. ? ? ?Erin Sober, NP  ? ?Chief Complaint:Medication management ? ?HPI: Erin Ayala is a 36 year old female presenting to [facility] behavioral health outpatient for follow-up psychiatric evaluation.  She has a psychiatric history of ADHD, bipolar 1 disorder and depression.  Her symptoms are managed with Adderall 5 mg daily, bupropion 300 mg daily, and Lamictal 100 mg daily.  She reports that her medications are effective with managing her symptoms and reports medication compliance.  Patient denies adverse effects.  Patient denies a history of cardiac issues, substance abuse, or illicit drug abuse.  Patient denies a need for dosage adjustment today.  Medications refilled at current dosages. ? ?Patient is alert and oriented x4, calm, pleasant and willing to engage.  Patient reports that her sleep cycle is disturbed at times due to her routine.  She denies suicidal or homicidal ideations, paranoia, delusional thought, auditory or  visual hallucinations. ? ?Visit Diagnosis:  ?  ICD-10-CM   ?1. Attention deficit hyperactivity disorder (ADHD), predominantly inattentive type  F90.0 buPROPion (WELLBUTRIN XL) 300 MG 24 hr tablet  ?  amphetamine-dextroamphetamine (ADDERALL) 5 MG tablet  ?  ?2. Mild episode of recurrent major depressive disorder (HCC)  F33.0 buPROPion (WELLBUTRIN XL) 300 MG 24 hr tablet  ?  ?3. Bipolar I disorder, most recent episode depressed (HCC)  F31.30 lamoTRIgine (LAMICTAL) 100 MG tablet  ?  ? ? ?Past Psychiatric History: See below ? ?Past Medical History:  ?Past Medical History:  ?Diagnosis Date  ? ADHD (attention deficit hyperactivity disorder)   ? Asthma high school  ? Exercised induced  ? Depression   ? Fetal heart rate decelerations affecting management of mother 04/21/2012  ?  ?Past Surgical History:  ?Procedure Laterality Date  ? NO PAST SURGERIES    ? ? ?Family Psychiatric History: See below ? ?Family History:  ?Family History  ?Problem Relation Age of Onset  ? Seizures Mother   ? Depression Father   ? Depression Brother   ? Anxiety disorder Sister   ? ? ?Social History:  ?Social History  ? ?Socioeconomic History  ? Marital status: Single  ?  Spouse name: Not on file  ? Number of children: Not on file  ? Years of education: Not on file  ? Highest education level: Not on file  ?Occupational History  ? Occupation: Agricultural consultant  ?  Comment: Refugee volunteer  ?Tobacco Use  ? Smoking status: Never  ? Smokeless tobacco: Never  ?Substance and Sexual Activity  ? Alcohol use: No  ?  Comment: occasional  ? Drug  use: No  ? Sexual activity: Yes  ?  Birth control/protection: None  ?Other Topics Concern  ? Not on file  ?Social History Narrative  ? Not on file  ? ?Social Determinants of Health  ? ?Financial Resource Strain: Not on file  ?Food Insecurity: Not on file  ?Transportation Needs: Not on file  ?Physical Activity: Not on file  ?Stress: Not on file  ?Social Connections: Not on file  ? ? ?Allergies: No Known Allergies ? ?Metabolic  Disorder Labs: ?No results found for: HGBA1C, MPG ?No results found for: PROLACTIN ?Lab Results  ?Component Value Date  ? CHOL 168 12/25/2019  ? TRIG 94 12/25/2019  ? HDL 57 12/25/2019  ? CHOLHDL 2.9 12/25/2019  ? LDLCALC 94 12/25/2019  ? ?No results found for: TSH ? ?Therapeutic Level Labs: ?No results found for: LITHIUM ?No results found for: VALPROATE ?No components found for:  CBMZ ? ?Current Medications: ?Current Outpatient Medications  ?Medication Sig Dispense Refill  ? amphetamine-dextroamphetamine (ADDERALL) 5 MG tablet Take 1 tablet (5 mg total) by mouth daily. 30 tablet 0  ? buPROPion (WELLBUTRIN XL) 300 MG 24 hr tablet Take 1 tablet (300 mg total) by mouth daily. 30 tablet 3  ? ibuprofen (ADVIL) 600 MG tablet Take 1 tablet (600 mg total) by mouth every 6 (six) hours as needed. (Patient not taking: Reported on 12/25/2019) 30 tablet 0  ? lamoTRIgine (LAMICTAL) 100 MG tablet Take 1 tablet (100 mg total) by mouth daily. 90 tablet 3  ? ?No current facility-administered medications for this visit.  ? ? ? ?Musculoskeletal: ?Strength & Muscle Tone: N/A virtual visit ?Gait & Station: N/A ?Patient leans: N/A ? ?Psychiatric Specialty Exam: ?Review of Systems  ?Psychiatric/Behavioral:  Negative for hallucinations, self-injury and suicidal ideas.   ?All other systems reviewed and are negative.  ?unknown if currently breastfeeding.There is no height or weight on file to calculate BMI.  ?General Appearance: Well Groomed  ?Eye Contact:  Good  ?Speech:  Clear and Coherent  ?Volume:  Normal  ?Mood:  Euthymic  ?Affect:  Congruent  ?Thought Process:  Coherent  ?Orientation:  Full (Time, Place, and Person)  ?Thought Content: Logical   ?Suicidal Thoughts:  No  ?Homicidal Thoughts:  No  ?Memory: Good  ?Judgement:  Good  ?Insight:  Good  ?Psychomotor Activity:  NA  ?Concentration: Good  ?Recall:  Good  ?Fund of Knowledge: Good  ?Language: Good  ?Akathisia:  NA  ?Handed:  Right  ?AIMS (if indicated): not done  ?Assets:   Communication Skills ?Desire for Improvement  ?ADL's:  Intact  ?Cognition: WNL  ?Sleep: Fair  ? ?Screenings: ?GAD-7   ? ?Flowsheet Row Video Visit from 02/12/2021 in [facility] Video Visit from 11/18/2020 in [facility] Video Visit from 08/13/2020 in [facility] Video Visit from 05/14/2020 in [facility] Video Visit from 02/14/2020 in [facility]  ?Total GAD-7 Score 11 11 9 10 9   ? ?  ? ?PHQ2-9   ? ?Flowsheet Row Video Visit from 02/12/2021 in [facility] Video Visit from 11/18/2020 in [facility] Video Visit from 08/13/2020 in [facility] Video Visit from 05/14/2020 in [facility] Video Visit from 02/14/2020 in [facility]  ?PHQ-2 Total Score 2 4 2 4 2   ?PHQ-9 Total Score 11 15 11 11 12   ? ?  ? ?Flowsheet [facility]  ?C-SSRS RISK CATEGORY No Risk No Risk  ? ?  ? ? ? ?Assessment and Plan:Colandra Lionel DecemberMachia is a 36 year old female [facility] for follow-up psychiatric evaluation.  She has a psychiatric history of ADHD, bipolar 1 disorder and depression.  Her symptoms are managed with Adderall 5 mg daily, bupropion 300 mg daily, and Lamictal 100 mg daily.  She reports that her medications are effective with managing her symptoms and reports medication compliance.  Patient denies adverse effects.  Patient denies a history of cardiac issues, substance abuse, or illicit drug abuse.  Patient denies a need for dosage adjustment today.  Medications refilled at current dosages. ? ?Collaboration of Care: Collaboration of Care: Medication Management AEB medications E scribed to  patient's preferred pharmacy ? ?Return to care and 3 months ?Continue therapy ? ?Patient/Guardian was advised Release of Information must be obtained prior to any record release in order to collaborate their care with an o

## 2021-05-03 ENCOUNTER — Encounter (HOSPITAL_COMMUNITY): Payer: Self-pay

## 2021-05-03 ENCOUNTER — Ambulatory Visit (HOSPITAL_COMMUNITY)
Admission: RE | Admit: 2021-05-03 | Discharge: 2021-05-03 | Disposition: A | Discharge status: Home / Self Care | Payer: 59 | Source: Ambulatory Visit | Attending: Family Medicine | Admitting: Family Medicine

## 2021-05-03 VITALS — BP 94/63 | HR 67 | Temp 98.9°F | Resp 17

## 2021-05-03 DIAGNOSIS — L739 Follicular disorder, unspecified: Secondary | ICD-10-CM | POA: Diagnosis not present

## 2021-05-03 MED ORDER — CEPHALEXIN 250 MG PO CAPS: 250.0000 mg | ORAL_CAPSULE | Freq: Three times a day (TID) | ORAL | 0 refills | Status: AC

## 2021-05-03 NOTE — ED Provider Notes (Signed)
?[facility] ? ? ? ?CSN: [id number] ?Arrival date & time: 05/03/21  1030 ? ? ?  ? ?History   ?Chief Complaint ?Chief Complaint  ?Patient presents with  ? SEXUALLY TRANSMITTED DISEASE  ? ? ?HPI ?Erin Ayala is a 36 y.o. female.  ? ?HPI ?For a 3-day history of some bumps in her inguinal areas.  She did shave the area about 2 days prior to that noting these bumps.  No drainage from them.  No fever or chills. ?Last menstrual period was March 20 ? ?Past Medical History:  ?Diagnosis Date  ? ADHD (attention deficit hyperactivity disorder)   ? Asthma high school  ? Exercised induced  ? Depression   ? Fetal heart rate decelerations affecting management of mother 04/21/2012  ? ? ?Patient Active Problem List  ? Diagnosis Date Noted  ? Bipolar I disorder, most recent episode depressed (HCC) 02/14/2020  ? Vacuum extractor delivery, delivered 04/22/2012  ? Asthma 10/12/2011  ? ADD (attention deficit disorder) 10/12/2011  ? Depression 10/12/2011  ? Irregular periods/menstrual cycles 10/12/2011  ? ? ?Past Surgical History:  ?Procedure Laterality Date  ? NO PAST SURGERIES    ? ? ?OB History   ? ? Gravida  ?1  ? Para  ?1  ? Term  ?1  ? Preterm  ?   ? AB  ?   ? Living  ?1  ?  ? ? SAB  ?   ? IAB  ?   ? Ectopic  ?   ? Multiple  ?   ? Live Births  ?1  ?   ?  ?  ? ? ? ?Home Medications   ? ?Prior to Admission medications   ?Medication Sig Start Date End Date Taking? Authorizing Provider  ?cephALEXin (KEFLEX) 250 MG capsule Take 1 capsule (250 mg total) by mouth 3 (three) times daily for 5 days. 05/03/21 05/08/21 Yes Taos Tapp, Janace Aris, MD  ?amphetamine-dextroamphetamine (ADDERALL) 5 MG tablet Take 1 tablet (5 mg total) by mouth daily. 04/28/21   Penn, Cranston Neighbor, NP  ?buPROPion (WELLBUTRIN XL) 300 MG 24 hr tablet Take 1 tablet (300 mg total) by mouth daily. 04/28/21   Penn, Cranston Neighbor, NP  ?lamoTRIgine (LAMICTAL) 100 MG tablet Take 1 tablet (100 mg total) by mouth daily. 04/28/21   Mcneil Sober, NP  ? ? ?Family History ?Family History   ?Problem Relation Age of Onset  ? Seizures Mother   ? Depression Father   ? Depression Brother   ? Anxiety disorder Sister   ? ? ?Social History ?Social History  ? ?Tobacco Use  ? Smoking status: Never  ? Smokeless tobacco: Never  ?Substance Use Topics  ? Alcohol use: No  ?  Comment: occasional  ? Drug use: No  ? ? ? ?Allergies   ?Patient has no known allergies. ? ? ?Review of Systems ?Review of Systems ? ? ?Physical Exam ?Triage Vital Signs ?ED Triage Vitals  ?Enc Vitals Group  ?   BP 05/03/21 1053 94/63  ?   Pulse Rate 05/03/21 1053 67  ?   Resp 05/03/21 1053 17  ?   Temp 05/03/21 1053 98.9 ?F (37.2 ?C)  ?   Temp Source 05/03/21 1053 Oral  ?   SpO2 05/03/21 1053 97 %  ?   Weight --   ?   Height --   ?   Head Circumference --   ?   Peak Flow --   ?   Pain Score 05/03/21 1051 0  ?  Pain Loc --   ?   Pain Edu? --   ?   Excl. in GC? --   ? ?No data found. ? ?Updated Vital Signs ?BP 94/63 (BP Location: Left Arm)   Pulse 67   Temp 98.9 ?F (37.2 ?C) (Oral)   Resp 17   LMP 04/13/2021 (Exact Date)   SpO2 97%  ? ?Visual Acuity ?Right Eye Distance:   ?Left Eye Distance:   ?Bilateral Distance:   ? ?Right Eye Near:   ?Left Eye Near:    ?Bilateral Near:    ? ?Physical Exam ?Vitals reviewed.  ?Constitutional:   ?   General: She is not in acute distress. ?   Appearance: She is not ill-appearing, toxic-appearing or diaphoretic.  ?Skin: ?   Comments: There are 2-3 mildly erythematous bumps in her right inguinal area and also the same amount in her left inguinal area.  They are each approximately half centimeter in diameter.  No fluctuance and no discharge.  ?Neurological:  ?   Mental Status: She is alert and oriented to person, place, and time.  ?Psychiatric:     ?   Behavior: Behavior normal.  ? ? ? ?UC Treatments / Results  ?Labs ?(all labs ordered are listed, but only abnormal results are displayed) ?Labs Reviewed - No data to display ? ?EKG ? ? ?Radiology ?No results found. ? ?Procedures ?Procedures (including critical  care time) ? ?Medications Ordered in UC ?Medications - No data to display ? ?Initial Impression / Assessment and Plan / UC Course  ?I have reviewed the triage vital signs and the nursing notes. ? ?Pertinent labs & imaging results that were available during my care of the patient were reviewed by me and considered in my medical decision making (see chart for details). ? ?  ? ?We will treat for folliculitis with 5 days of Keflex.  Discussed just tight clothing in that area to help prevent irritation ?Final Clinical Impressions(s) / UC Diagnoses  ? ?Final diagnoses:  ?Folliculitis  ? ? ? ?Discharge Instructions   ? ?  ?Take cephalexin 250 mg--1 capsule 3 times daily for 5 days ? ? ? ? ? ? ?ED Prescriptions   ? ? Medication Sig Dispense Auth. Provider  ? cephALEXin (KEFLEX) 250 MG capsule Take 1 capsule (250 mg total) by mouth 3 (three) times daily for 5 days. 15 capsule Zenia Resides, MD  ? ?  ? ?PDMP not reviewed this encounter. ?  ?Zenia Resides, MD ?05/03/21 1113 ? ?

## 2021-05-03 NOTE — ED Triage Notes (Signed)
Pt presents with c.o vaginal bumps that looks " scabby ". Pt states she recently shaved and does wear tight clothing.  ?

## 2021-05-03 NOTE — Discharge Instructions (Addendum)
Take cephalexin 250 mg--1 capsule 3 times daily for 5 days ? ? ?

## 2021-05-28 ENCOUNTER — Other Ambulatory Visit (HOSPITAL_COMMUNITY): Payer: Self-pay | Admitting: Psychiatry

## 2021-05-28 DIAGNOSIS — F900 Attention-deficit hyperactivity disorder, predominantly inattentive type: Secondary | ICD-10-CM

## 2021-05-28 DIAGNOSIS — F330 Major depressive disorder, recurrent, mild: Secondary | ICD-10-CM

## 2021-07-03 ENCOUNTER — Telehealth (HOSPITAL_COMMUNITY): Payer: Self-pay | Admitting: *Deleted

## 2021-07-03 NOTE — Telephone Encounter (Signed)
Opened a second time today in error.

## 2021-07-03 NOTE — Telephone Encounter (Signed)
VM left requesting her adderall be sent in she has a future appt with Dr Ronne Binning on 6/26.Per chart looks like she should have been out of her adderall a month ago. Its noon on fri Dr Ronne Binning leaves at noon so this request will be addressed next week.

## 2021-07-06 ENCOUNTER — Other Ambulatory Visit (HOSPITAL_COMMUNITY): Payer: Self-pay | Admitting: Psychiatry

## 2021-07-06 DIAGNOSIS — F900 Attention-deficit hyperactivity disorder, predominantly inattentive type: Secondary | ICD-10-CM

## 2021-07-06 MED ORDER — AMPHETAMINE-DEXTROAMPHETAMINE 5 MG PO TABS: 5.0000 mg | ORAL_TABLET | Freq: Every day | ORAL | 0 refills | Status: DC

## 2021-07-06 NOTE — Telephone Encounter (Signed)
Medication refilled and sent to preferred pharmacy

## 2021-07-20 ENCOUNTER — Telehealth (INDEPENDENT_AMBULATORY_CARE_PROVIDER_SITE_OTHER): Payer: 59 | Admitting: Psychiatry

## 2021-07-20 ENCOUNTER — Encounter (HOSPITAL_COMMUNITY): Payer: Self-pay | Admitting: Psychiatry

## 2021-07-20 DIAGNOSIS — F3130 Bipolar disorder, current episode depressed, mild or moderate severity, unspecified: Secondary | ICD-10-CM

## 2021-07-20 DIAGNOSIS — F900 Attention-deficit hyperactivity disorder, predominantly inattentive type: Secondary | ICD-10-CM | POA: Diagnosis not present

## 2021-07-20 MED ORDER — BUPROPION HCL ER (XL) 300 MG PO TB24: 300.0000 mg | ORAL_TABLET | Freq: Every day | ORAL | 3 refills | Status: DC

## 2021-07-20 MED ORDER — LAMOTRIGINE 100 MG PO TABS: 100.0000 mg | ORAL_TABLET | Freq: Every day | ORAL | 3 refills | Status: DC

## 2021-09-08 ENCOUNTER — Other Ambulatory Visit (HOSPITAL_COMMUNITY): Payer: Self-pay | Admitting: Psychiatry

## 2021-09-08 ENCOUNTER — Telehealth (HOSPITAL_COMMUNITY): Payer: Self-pay | Admitting: Psychiatry

## 2021-09-08 DIAGNOSIS — F900 Attention-deficit hyperactivity disorder, predominantly inattentive type: Secondary | ICD-10-CM

## 2021-09-08 MED ORDER — AMPHETAMINE-DEXTROAMPHETAMINE 5 MG PO TABS: 5.0000 mg | ORAL_TABLET | Freq: Every day | ORAL | 0 refills | Status: DC

## 2021-09-08 NOTE — Telephone Encounter (Signed)
Medication refilled and sent to preferred pharmacy

## 2021-10-08 ENCOUNTER — Telehealth (HOSPITAL_COMMUNITY): Payer: Self-pay | Admitting: Psychiatry

## 2021-10-09 ENCOUNTER — Other Ambulatory Visit (HOSPITAL_COMMUNITY): Payer: Self-pay | Admitting: Psychiatry

## 2021-10-09 DIAGNOSIS — F900 Attention-deficit hyperactivity disorder, predominantly inattentive type: Secondary | ICD-10-CM

## 2021-10-09 MED ORDER — AMPHETAMINE-DEXTROAMPHETAMINE 5 MG PO TABS: 5.0000 mg | ORAL_TABLET | Freq: Every day | ORAL | 0 refills | Status: DC

## 2021-10-09 NOTE — Telephone Encounter (Signed)
Medication refilled and sent to preferred pharmacy

## 2021-10-22 ENCOUNTER — Encounter (HOSPITAL_COMMUNITY): Payer: Self-pay | Admitting: Psychiatry

## 2021-10-22 ENCOUNTER — Telehealth (INDEPENDENT_AMBULATORY_CARE_PROVIDER_SITE_OTHER): Payer: No Payment, Other | Admitting: Psychiatry

## 2021-10-22 DIAGNOSIS — F3130 Bipolar disorder, current episode depressed, mild or moderate severity, unspecified: Secondary | ICD-10-CM | POA: Diagnosis not present

## 2021-10-22 DIAGNOSIS — F900 Attention-deficit hyperactivity disorder, predominantly inattentive type: Secondary | ICD-10-CM | POA: Diagnosis not present

## 2021-10-22 DIAGNOSIS — F411 Generalized anxiety disorder: Secondary | ICD-10-CM

## 2021-10-22 MED ORDER — BUPROPION HCL ER (XL) 300 MG PO TB24: 300.0000 mg | ORAL_TABLET | Freq: Every day | ORAL | 3 refills | Status: AC

## 2021-10-22 MED ORDER — AMPHETAMINE-DEXTROAMPHETAMINE 5 MG PO TABS: 5.0000 mg | ORAL_TABLET | Freq: Every day | ORAL | 0 refills | Status: DC

## 2021-10-22 MED ORDER — HYDROXYZINE HCL 10 MG PO TABS: 10.0000 mg | ORAL_TABLET | Freq: Three times a day (TID) | ORAL | 3 refills | Status: DC | PRN

## 2021-10-22 MED ORDER — LAMOTRIGINE 100 MG PO TABS: 100.0000 mg | ORAL_TABLET | Freq: Every day | ORAL | 3 refills | Status: AC

## 2021-10-22 NOTE — Progress Notes (Signed)
BH MD/PA/NP OP Progress Note Virtual Visit via Video Note  I connected with Erin Ayala on 10/22/21 at 11:00 AM EDT by a video enabled telemedicine application and verified that I am speaking with the correct person using two identifiers.  Location: Patient: Home Provider: Clinic   I discussed the limitations of evaluation and management by telemedicine and the availability of in person appointments. The patient expressed understanding and agreed to proceed.  I provided 30 minutes of non-face-to-face time during this encounter.       10/22/2021 11:19 AM Erin Ayala  MRN:  [id number]  Chief Complaint: "Currently I feel stable but have some anxiety"  HPI: 36 year old female seen today for follow up psychiatric evaluation. She has a psychiatric history of Bipolar disorder, depression, and ADHD. She is currently managed on Adderall 5  mg daily, Wellbutrin 300 mg daily, and Lamictal 100 daily. She notes her medications are somewhat effective in managing her psychiatric conditions.   Today she was well groomed, pleasant, cooperative, and engaged in conversation. She informed Clinical research associate that currently she feels mentally stable but notes that at times she feels anxious. Today provider conducted a GAD-7 and patient scored a 13, at her last visit she scored a 14.   Provider also conducted PHQ-9 and patient scored a 12, at her last visit he scored a 12.  She endorses adequate sleep and appetite.  Today she denies SI/HI/VAH or paranoia.  Today patient agreeable to starting hydroxyzine 10 mg three times daily as needed to help manage anxiety and depression.Potential side effects of medication and risks vs benefits of treatment vs non-treatment were explained and discussed. All questions were answered. She will continue all other medications.   No other concerns noted at this time.     Visit Diagnosis:    ICD-10-CM   1. Generalized anxiety disorder  F41.1 hydrOXYzine (ATARAX) 10 MG tablet     2. Bipolar I disorder, most recent episode depressed (HCC)  F31.30 lamoTRIgine (LAMICTAL) 100 MG tablet    3. Attention deficit hyperactivity disorder (ADHD), predominantly inattentive type  F90.0 buPROPion (WELLBUTRIN XL) 300 MG 24 hr tablet    amphetamine-dextroamphetamine (ADDERALL) 5 MG tablet      Past Psychiatric History:  Bipolar disorder, depression, and ADHD  Past Medical History:  Past Medical History:  Diagnosis Date   ADHD (attention deficit hyperactivity disorder)    Asthma high school   Exercised induced   Depression    Fetal heart rate decelerations affecting management of mother 04/21/2012    Past Surgical History:  Procedure Laterality Date   NO PAST SURGERIES      Family Psychiatric History:  Father ADHD and depression, Sister ADHD and anxiety, Paternal grandparents   Family History:  Family History  Problem Relation Age of Onset   Seizures Mother    Depression Father    Depression Brother    Anxiety disorder Sister     Social History:  Social History   Socioeconomic History   Marital status: Single    Spouse name: Not on file   Number of children: Not on file   Years of education: Not on file   Highest education level: Not on file  Occupational History   Occupation: Agricultural consultant    Comment: Refugee volunteer  Tobacco Use   Smoking status: Never   Smokeless tobacco: Never  Substance and Sexual Activity   Alcohol use: No    Comment: occasional   Drug use: No   Sexual activity:  Yes    Birth control/protection: None  Other Topics Concern   Not on file  Social History Narrative   Not on file   Social Determinants of Health   Financial Resource Strain: Not on file  Food Insecurity: Not on file  Transportation Needs: Not on file  Physical Activity: Not on file  Stress: Not on file  Social Connections: Not on file    Allergies: No Known Allergies  Metabolic Disorder Labs: No results found for: "HGBA1C", "MPG" No results found for:  "PROLACTIN" Lab Results  Component Value Date   CHOL 168 12/25/2019   TRIG 94 12/25/2019   HDL 57 12/25/2019   CHOLHDL 2.9 12/25/2019   LDLCALC 94 12/25/2019   No results found for: "TSH"  Therapeutic Level Labs: No results found for: "LITHIUM" No results found for: "VALPROATE" No results found for: "CBMZ"  Current Medications: Current Outpatient Medications  Medication Sig Dispense Refill   hydrOXYzine (ATARAX) 10 MG tablet Take 1 tablet (10 mg total) by mouth 3 (three) times daily as needed. 90 tablet 3   amphetamine-dextroamphetamine (ADDERALL) 5 MG tablet Take 1 tablet (5 mg total) by mouth daily. 30 tablet 0   buPROPion (WELLBUTRIN XL) 300 MG 24 hr tablet Take 1 tablet (300 mg total) by mouth daily. 90 tablet 3   lamoTRIgine (LAMICTAL) 100 MG tablet Take 1 tablet (100 mg total) by mouth daily. 90 tablet 3   No current facility-administered medications for this visit.     Musculoskeletal: Strength & Muscle Tone: within normal limits and Telehealth visit Gait & Station: normal, Unable to assess due to telehealth visit Patient leans: N/A  Psychiatric Specialty Exam: Review of Systems  unknown if currently breastfeeding.There is no height or weight on file to calculate BMI.  General Appearance: Well Groomed  Eye Contact:  Good  Speech:  Clear and Coherent and Normal Rate  Volume:  Normal  Mood:  Anxious  Affect:  Appropriate and Congruent  Thought Process:  Coherent, Goal Directed and Linear  Orientation:  Full (Time, Place, and Person)  Thought Content: WDL and Logical   Suicidal Thoughts:  No  Homicidal Thoughts:  No  Memory:  Immediate;   Good Recent;   Good Remote;   Good  Judgement:  Good  Insight:  Good  Psychomotor Activity:  Normal  Concentration:  Concentration: Good and Attention Span: Good  Recall:  Good  Fund of Knowledge: Good  Language: Good  Akathisia:  No  Handed:  Right  AIMS (if indicated): Not done  Assets:  Communication Skills Desire  for Improvement Financial Resources/Insurance Housing Social Support  ADL's:  Intact  Cognition: WNL  Sleep:  Good   Screenings: GAD-7    Flowsheet Row Video Visit from 10/22/2021 in [facility] Video Visit from 07/20/2021 in [facility] Video Visit from 02/12/2021 in [facility] Video Visit from 11/18/2020 in [facility] Video Visit from 08/13/2020 in [facility]  Total GAD-7 Score 13 14 11 11 9       PHQ2-9    Flowsheet Row Video Visit from 10/22/2021 in [facility] Video Visit from 07/20/2021 in [facility] Video Visit from 02/12/2021 in [facility] Video Visit from 11/18/2020 in [facility] Video Visit from 08/13/2020 in [facility]  PHQ-2 Total Score 2 3 2 4 2   PHQ-9 Total Score  12 12 11 15 11       Flowsheet Row ED from 05/03/2021 in [facility] ED from 03/21/2021 in [facility] Video Visit from 05/14/2020 in [place] No Risk No Risk No Risk        Assessment and Plan: Patient endorses is symptoms of anxiety and reports that his depression is improving. Today patient agreeable to starting hydroxyzine 10 mg three times daily as needed to help manage anxiety and depression.  1. Bipolar I disorder, most recent episode depressed (HCC)  Continue- lamoTRIgine (LAMICTAL) 100 MG tablet; Take 1 tablet (100 mg total) by mouth daily.  Dispense: 90 tablet; Refill: 3  2. Attention deficit hyperactivity disorder (ADHD), predominantly inattentive type  Continue- buPROPion (WELLBUTRIN XL) 300 MG 24 hr tablet; Take 1 tablet (300 mg total) by mouth daily.  Dispense: 90 tablet; Refill: 3 Continue-  amphetamine-dextroamphetamine (ADDERALL) 5 MG tablet; Take 1 tablet (5 mg total) by mouth daily.  Dispense: 30 tablet; Refill: 0  3. Generalized anxiety disorder  Start- hydrOXYzine (ATARAX) 10 MG tablet; Take 1 tablet (10 mg total) by mouth 3 (three) times daily as needed.  Dispense: 90 tablet; Refill: 3  Follow-up in 3 months Follow up with thearpy  Salley Slaughter, NP 10/22/2021, 11:19 AM

## 2021-11-05 ENCOUNTER — Other Ambulatory Visit (HOSPITAL_COMMUNITY): Payer: Self-pay | Admitting: Psychiatry

## 2021-11-05 DIAGNOSIS — F411 Generalized anxiety disorder: Secondary | ICD-10-CM

## 2021-12-25 ENCOUNTER — Telehealth (HOSPITAL_COMMUNITY): Payer: Self-pay | Admitting: Student in an Organized Health Care Education/Training Program

## 2021-12-25 DIAGNOSIS — F900 Attention-deficit hyperactivity disorder, predominantly inattentive type: Secondary | ICD-10-CM

## 2021-12-25 MED ORDER — AMPHETAMINE-DEXTROAMPHETAMINE 5 MG PO TABS: 5.0000 mg | ORAL_TABLET | Freq: Every day | ORAL | 0 refills | Status: DC

## 2021-12-25 NOTE — Telephone Encounter (Signed)
Received message that patient needed refill of her Adderall.  This was sent.    Sent: -Adderall 5 mg daily.  30 tablets with 0 refills.   Arna Snipe MD Resident

## 2022-01-22 ENCOUNTER — Encounter (HOSPITAL_COMMUNITY): Payer: Self-pay | Admitting: Student in an Organized Health Care Education/Training Program

## 2022-01-22 ENCOUNTER — Telehealth (INDEPENDENT_AMBULATORY_CARE_PROVIDER_SITE_OTHER): Payer: No Payment, Other | Admitting: Student in an Organized Health Care Education/Training Program

## 2022-01-22 DIAGNOSIS — F3130 Bipolar disorder, current episode depressed, mild or moderate severity, unspecified: Secondary | ICD-10-CM

## 2022-01-22 DIAGNOSIS — F411 Generalized anxiety disorder: Secondary | ICD-10-CM

## 2022-01-22 DIAGNOSIS — F900 Attention-deficit hyperactivity disorder, predominantly inattentive type: Secondary | ICD-10-CM

## 2022-01-22 MED ORDER — AMPHETAMINE-DEXTROAMPHETAMINE 5 MG PO TABS: 5.0000 mg | ORAL_TABLET | Freq: Every day | ORAL | 0 refills | Status: DC

## 2022-01-22 NOTE — Progress Notes (Signed)
BH MD/PA/NP OP Progress Note    Virtual Visit via Video Note  I connected with Erin Ayala on 01/22/22 at  1:00 PM EST by a video enabled telemedicine application and verified that I am speaking with the correct person using two identifiers.  Location: Patient: Home Provider: [facility]   I discussed the limitations of evaluation and management by telemedicine and the availability of in person appointments. The patient expressed understanding and agreed to proceed.  01/22/2022 1:23 PM Erin Ayala  MRN:  [id number]  Chief Complaint:  Chief Complaint  Patient presents with   Follow-up   HPI:  Erin Ayala is a 36 yr old female who presents via Virtual Video Visit for follow up and medication management. PPHx is significant for Bipolar Disorder, GAD, and ADHD.   She reports that she has been doing okay since her last appointment.  She reports that since starting paroxetine she has noticed that it has been helpful in preventing spikes in anxiety but not so helpful if she is already having an increase in anxiety.  She reports that when she is taking it she has noticed some lower blood pressure.  She reports this is a consistent issue for her and that she does tend to stay dehydrated.  Encouraged her to increase her fluid intake.  Briefly discussed trialing BuSpar, however, she wishes to continue with an as needed medication instead of a scheduled 1.  She reports that she has been getting better about consistently taking her medications.  Discussed with her the importance of taking her Lamictal consistently and she reported understanding.  She reports no SI, HI, or AVH.  She reports her sleep is okay.  She reports her appetite is okay.  She reports no other concerns at present.  She will return for follow-up in approximately 3 months.    Visit Diagnosis:    ICD-10-CM   1. Bipolar I disorder, most recent episode depressed (HCC)  F31.30     2. Generalized anxiety disorder  F41.1      3. Attention deficit hyperactivity disorder (ADHD), predominantly inattentive type  F90.0       Past Psychiatric History: Bipolar Disorder, GAD, and ADHD.   Past Medical History:  Past Medical History:  Diagnosis Date   ADHD (attention deficit hyperactivity disorder)    Asthma high school   Exercised induced   Depression    Fetal heart rate decelerations affecting management of mother 04/21/2012    Past Surgical History:  Procedure Laterality Date   NO PAST SURGERIES      Family Psychiatric History: Father- ADHD and depression Sister- ADHD and anxiety  Paternal Grandparents- Depression   Family History:  Family History  Problem Relation Age of Onset   Seizures Mother    Depression Father    Depression Brother    Anxiety disorder Sister     Social History:  Social History   Socioeconomic History   Marital status: Single    Spouse name: Not on file   Number of children: Not on file   Years of education: Not on file   Highest education level: Not on file  Occupational History   Occupation: Agricultural consultant    Comment: Refugee volunteer  Tobacco Use   Smoking status: Never   Smokeless tobacco: Never  Substance and Sexual Activity   Alcohol use: No    Comment: occasional   Drug use: No   Sexual activity: Yes    Birth control/protection: None  Other Topics Concern  Not on file  Social History Narrative   Not on file   Social Determinants of Health   Financial Resource Strain: Not on file  Food Insecurity: Not on file  Transportation Needs: Not on file  Physical Activity: Not on file  Stress: Not on file  Social Connections: Not on file    Allergies: No Known Allergies  Metabolic Disorder Labs: No results found for: "HGBA1C", "MPG" No results found for: "PROLACTIN" Lab Results  Component Value Date   CHOL 168 12/25/2019   TRIG 94 12/25/2019   HDL 57 12/25/2019   CHOLHDL 2.9 12/25/2019   LDLCALC 94 12/25/2019   No results found for:  "TSH"  Therapeutic Level Labs: No results found for: "LITHIUM" No results found for: "VALPROATE" No results found for: "CBMZ"  Current Medications: Current Outpatient Medications  Medication Sig Dispense Refill   amphetamine-dextroamphetamine (ADDERALL) 5 MG tablet Take 1 tablet (5 mg total) by mouth daily. 30 tablet 0   buPROPion (WELLBUTRIN XL) 300 MG 24 hr tablet Take 1 tablet (300 mg total) by mouth daily. 90 tablet 3   hydrOXYzine (ATARAX) 10 MG tablet TAKE 1 TABLET BY MOUTH THREE TIMES A DAY AS NEEDED 270 tablet 2   lamoTRIgine (LAMICTAL) 100 MG tablet Take 1 tablet (100 mg total) by mouth daily. 90 tablet 3   No current facility-administered medications for this visit.     Musculoskeletal: Strength & Muscle Tone: within normal limits Gait & Station:  sitting during interview Patient leans: N/A  Psychiatric Specialty Exam: Review of Systems  Respiratory:  Negative for shortness of breath.   Cardiovascular:  Negative for chest pain.  Gastrointestinal:  Negative for abdominal pain, constipation, diarrhea, nausea and vomiting.  Neurological:  Negative for dizziness, weakness and headaches.  Psychiatric/Behavioral:  Negative for dysphoric mood, hallucinations, sleep disturbance and suicidal ideas. The patient is nervous/anxious.     unknown if currently breastfeeding.There is no height or weight on file to calculate BMI.  General Appearance: Casual and Fairly Groomed  Eye Contact:  Good  Speech:  Clear and Coherent and Normal Rate  Volume:  Normal  Mood:  Dysphoric  Affect:  Congruent  Thought Process:  Coherent and Goal Directed  Orientation:  Full (Time, Place, and Person)  Thought Content: WDL and Logical   Suicidal Thoughts:  No  Homicidal Thoughts:  No  Memory:  Immediate;   Good Recent;   Good  Judgement:  Good  Insight:  Good  Psychomotor Activity:  Normal  Concentration:  Concentration: Good and Attention Span: Good  Recall:  Good  Fund of Knowledge: Good   Language: Good  Akathisia:  Negative  Handed:  Right  AIMS (if indicated): not done  Assets:  Communication Skills Desire for Improvement Financial Resources/Insurance Housing Resilience Social Support  ADL's:  Intact  Cognition: WNL  Sleep:  Fair   Screenings: GAD-7    Flowsheet Row Video Visit from 10/22/2021 in [facility] Video Visit from 07/20/2021 in [facility] Video Visit from 02/12/2021 in [facility] Video Visit from 11/18/2020 in [facility] Video Visit from 08/13/2020 in [facility]  Total GAD-7 Score 13 14 11 11 9       PHQ2-9    Flowsheet Row Video Visit from 10/22/2021 in [facility] Video Visit from 07/20/2021 in [facility] Video Visit from 02/12/2021 in [facility] Video Visit from  11/18/2020 in [facility] Video Visit from 08/13/2020 in [facility]  PHQ-2 Total Score 2 3 2 4 2   PHQ-9 Total Score 12 12 11 15 11       Flowsheet Row ED from 05/03/2021 in [facility] ED from 03/21/2021 in [facility] at [facility] Video Visit from 05/14/2020 in [facility]  C-SSRS RISK CATEGORY No Risk No Risk No Risk        Assessment and Plan:  Erin Ayala is a 36 yr old female who presents via Virtual Video Visit for follow up and medication management. PPHx is significant for Bipolar Disorder, GAD, and ADHD.   Erin Ayala has continued to do well with her medications.  She is having some low blood pressure issues which she attributes to the Hydroxyzine but given dehydration issues this is most likely cause.  We will not make any changes to her medications at this time.  She will return follow-up approximately 3 months.   Bipolar I  disorder, most recent episode depressed: -Continue Lamictal 100 mg daily.  No refills sent at this time.   ADHD: -Continue Wellbutrin XL 300 mg daily.  No refills sent at this time. -Continue Adderall 5 mg daily.  30 tablets with 0 refills.   GAD: -Continue Hydroxyzine 10 mg TID PRN.  No refills sent at this time.    Collaboration of Care:   Patient/Guardian was advised Release of Information must be obtained prior to any record release in order to collaborate their care with an outside provider. Patient/Guardian was advised if they have not already done so to contact the registration department to sign all necessary forms in order for 20 to release information regarding their care.   Consent: Patient/Guardian gives verbal consent for treatment and assignment of benefits for services provided during this visit. Patient/Guardian expressed understanding and agreed to proceed.    Wyatt Mage, MD 01/22/2022, 1:23 PM    Follow Up Instructions:    I discussed the assessment and treatment plan with the patient. The patient was provided an opportunity to ask questions and all were answered. The patient agreed with the plan and demonstrated an understanding of the instructions.   The patient was advised to call back or seek an in-person evaluation if the symptoms worsen or if the condition fails to improve as anticipated.  I provided 18 minutes of non-face-to-face time during this encounter.   Lauro Franklin, MD

## 2022-03-04 ENCOUNTER — Telehealth (HOSPITAL_COMMUNITY): Payer: Self-pay | Admitting: *Deleted

## 2022-03-04 DIAGNOSIS — F900 Attention-deficit hyperactivity disorder, predominantly inattentive type: Secondary | ICD-10-CM

## 2022-03-04 MED ORDER — AMPHETAMINE-DEXTROAMPHETAMINE 5 MG PO TABS: 5.0000 mg | ORAL_TABLET | Freq: Every day | ORAL | 0 refills | Status: AC

## 2022-03-04 NOTE — Telephone Encounter (Signed)
Patient called asking for refill of Adderall. Chart reviewed and no future appointment was scheduled. Patient transferred to front desk to schedule appointment. Message sent to MD for review of medication.

## 2022-03-04 NOTE — Telephone Encounter (Signed)
Received message that patient requested refill of Adderall.  Per PDMP last filled on 1/2.  Refill was sent.   Sent: -Adderall 5 mg daily.  30 tablets with 0 refills.   Fatima Sanger MD Resident

## 2022-03-22 DIAGNOSIS — F332 Major depressive disorder, recurrent severe without psychotic features: Secondary | ICD-10-CM | POA: Diagnosis not present

## 2022-03-30 DIAGNOSIS — F332 Major depressive disorder, recurrent severe without psychotic features: Secondary | ICD-10-CM | POA: Diagnosis not present

## 2022-04-05 DIAGNOSIS — F332 Major depressive disorder, recurrent severe without psychotic features: Secondary | ICD-10-CM | POA: Diagnosis not present

## 2022-04-12 DIAGNOSIS — F332 Major depressive disorder, recurrent severe without psychotic features: Secondary | ICD-10-CM | POA: Diagnosis not present

## 2022-04-27 DIAGNOSIS — F332 Major depressive disorder, recurrent severe without psychotic features: Secondary | ICD-10-CM | POA: Diagnosis not present

## 2022-05-03 DIAGNOSIS — F3181 Bipolar II disorder: Secondary | ICD-10-CM | POA: Diagnosis not present

## 2022-05-10 DIAGNOSIS — F3181 Bipolar II disorder: Secondary | ICD-10-CM | POA: Diagnosis not present

## 2022-05-24 DIAGNOSIS — F3181 Bipolar II disorder: Secondary | ICD-10-CM | POA: Diagnosis not present

## 2022-05-24 DIAGNOSIS — F900 Attention-deficit hyperactivity disorder, predominantly inattentive type: Secondary | ICD-10-CM | POA: Diagnosis not present

## 2022-05-24 DIAGNOSIS — F411 Generalized anxiety disorder: Secondary | ICD-10-CM | POA: Diagnosis not present

## 2022-05-25 DIAGNOSIS — F332 Major depressive disorder, recurrent severe without psychotic features: Secondary | ICD-10-CM | POA: Diagnosis not present

## 2022-05-25 DIAGNOSIS — F900 Attention-deficit hyperactivity disorder, predominantly inattentive type: Secondary | ICD-10-CM | POA: Diagnosis not present

## 2022-05-25 DIAGNOSIS — F3181 Bipolar II disorder: Secondary | ICD-10-CM | POA: Diagnosis not present

## 2022-06-14 DIAGNOSIS — F900 Attention-deficit hyperactivity disorder, predominantly inattentive type: Secondary | ICD-10-CM | POA: Diagnosis not present

## 2022-06-14 DIAGNOSIS — F411 Generalized anxiety disorder: Secondary | ICD-10-CM | POA: Diagnosis not present

## 2022-06-14 DIAGNOSIS — F3181 Bipolar II disorder: Secondary | ICD-10-CM | POA: Diagnosis not present

## 2022-06-23 DIAGNOSIS — F3181 Bipolar II disorder: Secondary | ICD-10-CM | POA: Diagnosis not present

## 2022-06-23 DIAGNOSIS — F900 Attention-deficit hyperactivity disorder, predominantly inattentive type: Secondary | ICD-10-CM | POA: Diagnosis not present

## 2022-06-23 DIAGNOSIS — F411 Generalized anxiety disorder: Secondary | ICD-10-CM | POA: Diagnosis not present

## 2022-06-28 DIAGNOSIS — F411 Generalized anxiety disorder: Secondary | ICD-10-CM | POA: Diagnosis not present

## 2022-06-28 DIAGNOSIS — F900 Attention-deficit hyperactivity disorder, predominantly inattentive type: Secondary | ICD-10-CM | POA: Diagnosis not present

## 2022-06-28 DIAGNOSIS — F3181 Bipolar II disorder: Secondary | ICD-10-CM | POA: Diagnosis not present

## 2022-06-29 DIAGNOSIS — F332 Major depressive disorder, recurrent severe without psychotic features: Secondary | ICD-10-CM | POA: Diagnosis not present

## 2022-06-29 DIAGNOSIS — F3181 Bipolar II disorder: Secondary | ICD-10-CM | POA: Diagnosis not present

## 2022-06-29 DIAGNOSIS — F900 Attention-deficit hyperactivity disorder, predominantly inattentive type: Secondary | ICD-10-CM | POA: Diagnosis not present

## 2022-07-05 DIAGNOSIS — F3181 Bipolar II disorder: Secondary | ICD-10-CM | POA: Diagnosis not present

## 2022-07-05 DIAGNOSIS — F900 Attention-deficit hyperactivity disorder, predominantly inattentive type: Secondary | ICD-10-CM | POA: Diagnosis not present

## 2022-07-05 DIAGNOSIS — F411 Generalized anxiety disorder: Secondary | ICD-10-CM | POA: Diagnosis not present

## 2022-07-19 DIAGNOSIS — F3181 Bipolar II disorder: Secondary | ICD-10-CM | POA: Diagnosis not present

## 2022-07-19 DIAGNOSIS — F900 Attention-deficit hyperactivity disorder, predominantly inattentive type: Secondary | ICD-10-CM | POA: Diagnosis not present

## 2022-07-19 DIAGNOSIS — F411 Generalized anxiety disorder: Secondary | ICD-10-CM | POA: Diagnosis not present

## 2022-08-01 ENCOUNTER — Encounter (HOSPITAL_COMMUNITY): Payer: Self-pay | Admitting: *Deleted

## 2022-08-01 ENCOUNTER — Ambulatory Visit (HOSPITAL_COMMUNITY)
Admission: EM | Admit: 2022-08-01 | Discharge: 2022-08-01 | Disposition: A | Discharge status: Home / Self Care | Payer: 59 | Attending: Emergency Medicine | Admitting: Emergency Medicine

## 2022-08-01 ENCOUNTER — Ambulatory Visit (INDEPENDENT_AMBULATORY_CARE_PROVIDER_SITE_OTHER): Payer: 59

## 2022-08-01 DIAGNOSIS — S62365A Nondisplaced fracture of neck of fourth metacarpal bone, left hand, initial encounter for closed fracture: Secondary | ICD-10-CM

## 2022-08-01 DIAGNOSIS — S62615A Displaced fracture of proximal phalanx of left ring finger, initial encounter for closed fracture: Secondary | ICD-10-CM | POA: Diagnosis not present

## 2022-08-01 NOTE — ED Notes (Signed)
Made ortho tech aware of splint ordered. ETA 40 mins
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
## 2022-08-01 NOTE — Discharge Instructions (Addendum)
Orthopedics have applied a splint to your hand for your fractured metacarpal.  Please keep the splint clean and dry.  Follow-up with orthopedics sometime this week, and call Erin Ayala when they open on Monday to schedule follow-up.  For pain control you can alternate between 800 mg of ibuprofen and 500 mg of Tylenol every 4-6 hours.  Please keep the extremity elevated to help with swelling.  Seek immediate care if you develop finger discoloration, numbness, tingling, or any new concerning symptoms.

## 2022-08-01 NOTE — ED Notes (Signed)
Ortho tech at bedside 

## 2022-08-01 NOTE — Progress Notes (Signed)
Orthopedic Tech Progress Note Patient Details:  Erin Ayala 1985/09/22 [id number]  Ortho Devices Type of Ortho Device: Ulna gutter splint Ortho Device/Splint Location: LUE Ortho Device/Splint Interventions: Ordered, Application, Adjustment   Post Interventions Patient Tolerated: Well Instructions Provided: Care of device, Adjustment of device  Krisha Beegle [name] 08/01/2022, 7:25 PM

## 2022-08-01 NOTE — ED Provider Notes (Signed)
[facility]    CSN: [id number] Arrival date & time: 08/01/22  1513      History   Chief Complaint Chief Complaint  Patient presents with   Hand Injury    HPI Erin Ayala is a 37 y.o. female.   Patient presents to clinic for left hand bruising, swelling and pain after injury playing roller Derby a week prior.  She reports she was guarding when people hit her outstretched hand.  She did continue to play on the hand, thinks she may have fallen on it at 1 point.  She has not been taking any medications for pain.  Reports she is concerned because her grip strength is diminished, bruising is over fourth and fifth metacarpals.  Denies any previous injury to this hand.  The history is provided by the patient and medical records.  Hand Injury   Past Medical History:  Diagnosis Date   ADHD (attention deficit hyperactivity disorder)    Asthma high school   Exercised induced   Depression    Fetal heart rate decelerations affecting management of mother 04/21/2012    Patient Active Problem List   Diagnosis Date Noted   Bipolar I disorder, most recent episode depressed (HCC) 02/14/2020   Vacuum extractor delivery, delivered 04/22/2012   Asthma 10/12/2011   ADD (attention deficit disorder) 10/12/2011   Depression 10/12/2011   Irregular periods/menstrual cycles 10/12/2011    Past Surgical History:  Procedure Laterality Date   NO PAST SURGERIES      OB History     Gravida  1   Para  1   Term  1   Preterm      AB      Living  1      SAB      IAB      Ectopic      Multiple      Live Births  1            Home Medications    Prior to Admission medications   Medication Sig Start Date End Date Taking? Authorizing Provider  amphetamine-dextroamphetamine (ADDERALL) 5 MG tablet Take 1 tablet (5 mg total) by mouth daily. 03/04/22  Yes Lauro Franklin, MD  buPROPion (WELLBUTRIN XL) 300 MG 24 hr tablet Take 1 tablet (300 mg total) by mouth  daily. 10/22/21  Yes Toy Cookey E, NP  hydrOXYzine (ATARAX) 10 MG tablet TAKE 1 TABLET BY MOUTH THREE TIMES A DAY AS NEEDED 11/06/21   Toy Cookey E, NP  lamoTRIgine (LAMICTAL) 100 MG tablet Take 1 tablet (100 mg total) by mouth daily. 10/22/21   Shanna Cisco, NP    Family History Family History  Problem Relation Age of Onset   Seizures Mother    Depression Father    Depression Brother    Anxiety disorder Sister     Social History Social History   Tobacco Use   Smoking status: Never   Smokeless tobacco: Never  Vaping Use   Vaping Use: Never used  Substance Use Topics   Alcohol use: No    Comment: occasional   Drug use: No     Allergies   Patient has no known allergies.   Review of Systems Review of Systems  Musculoskeletal:  Positive for joint swelling.     Physical Exam Triage Vital Signs ED Triage Vitals  Enc Vitals Group     BP 08/01/22 1528 110/73     Pulse Rate 08/01/22 1528 80  Resp 08/01/22 1528 16     Temp 08/01/22 1528 98.7 F (37.1 C)     Temp Source 08/01/22 1528 Oral     SpO2 08/01/22 1528 98 %     Weight --      Height --      Head Circumference --      Peak Flow --      Pain Score 08/01/22 1527 5     Pain Loc --      Pain Edu? --      Excl. in GC? --    No data found.  Updated Vital Signs BP 110/73 (BP Location: Right Arm)   Pulse 80   Temp 98.7 F (37.1 C) (Oral)   Resp 16   LMP 07/06/2022 (Approximate)   SpO2 98%   Visual Acuity Right Eye Distance:   Left Eye Distance:   Bilateral Distance:    Right Eye Near:   Left Eye Near:    Bilateral Near:     Physical Exam Vitals and nursing note reviewed.  Constitutional:      Appearance: Normal appearance.  HENT:     Head: Normocephalic and atraumatic.     Right Ear: External ear normal.     Left Ear: External ear normal.     Nose: Nose normal.     Mouth/Throat:     Mouth: Mucous membranes are moist.  Eyes:     Conjunctiva/sclera: Conjunctivae  normal.  Cardiovascular:     Rate and Rhythm: Normal rate.     Pulses: Normal pulses.  Pulmonary:     Effort: Pulmonary effort is normal. No respiratory distress.  Musculoskeletal:        General: Swelling, tenderness and signs of injury present. No deformity. Normal range of motion.     Cervical back: Normal range of motion.  Skin:    General: Skin is warm and dry.     Capillary Refill: Capillary refill takes less than 2 seconds.  Neurological:     General: No focal deficit present.     Mental Status: She is alert and oriented to person, place, and time.  Psychiatric:        Mood and Affect: Mood normal.        Behavior: Behavior normal. Behavior is cooperative.      UC Treatments / Results  Labs (all labs ordered are listed, but only abnormal results are displayed) Labs Reviewed - No data to display  EKG   Radiology DG Hand Complete Left  Result Date: 08/01/2022 CLINICAL DATA:  Left hand pain after injury. EXAM: LEFT HAND - COMPLETE 3+ VIEW COMPARISON:  None. FINDINGS: Oblique fracture noted in the proximal diaphysis of the ring finger metacarpal with a proximally 1-2 cm of fragment distraction. No substantial angulation at the fracture site. No other acute fracture evident. No subluxation or dislocation. No worrisome lytic or sclerotic osseous abnormality. IMPRESSION: Oblique fracture in the proximal diaphysis of the ring finger metacarpal with 1-2 cm of fragment distraction. Electronically Signed   By: Kennith Center M.D.   On: 08/01/2022 16:04    Procedures Procedures (including critical care time)  Medications Ordered in UC Medications - No data to display  Initial Impression / Assessment and Plan / UC Course  I have reviewed the triage vital signs and the nursing notes.  Pertinent labs & imaging results that were available during my care of the patient were reviewed by me and considered in my medical decision making (see chart for details).  Vitals and triage  reviewed, patient is hemodynamically stable.  Left hand with bruising, swelling and tenderness over fourth and fifth metacarpal from injury a week ago.  Sensation intact, brisk capillary refill.  Diminished handgrip strength.  Imaging shows fracture of proximal diaphysis of the ring finger metacarpal with 1-2 cm of fragment distraction. Ulnar gutter applied by ortho and given information for orthopedic follow-up.  Plan of care, follow-up care and return precautions given, no questions at this time.    Final Clinical Impressions(s) / UC Diagnoses   Final diagnoses:  Closed nondisplaced fracture of neck of fourth metacarpal bone of left hand, initial encounter     Discharge Instructions      Orthopedics have applied a splint to your hand for your fractured metacarpal.  Please keep the splint clean and dry.  Follow-up with orthopedics sometime this week, and call Delbert Harness when they open on Monday to schedule follow-up.  For pain control you can alternate between 800 mg of ibuprofen and 500 mg of Tylenol every 4-6 hours.  Please keep the extremity elevated to help with swelling.  Seek immediate care if you develop finger discoloration, numbness, tingling, or any new concerning symptoms.       ED Prescriptions   None    PDMP not reviewed this encounter.   Mylan Lengyel, [place], [place] 08/01/22 1745

## 2022-08-02 DIAGNOSIS — F411 Generalized anxiety disorder: Secondary | ICD-10-CM | POA: Diagnosis not present

## 2022-08-02 DIAGNOSIS — S62305A Unspecified fracture of fourth metacarpal bone, left hand, initial encounter for closed fracture: Secondary | ICD-10-CM | POA: Diagnosis not present

## 2022-08-02 DIAGNOSIS — F3181 Bipolar II disorder: Secondary | ICD-10-CM | POA: Diagnosis not present

## 2022-08-02 DIAGNOSIS — F900 Attention-deficit hyperactivity disorder, predominantly inattentive type: Secondary | ICD-10-CM | POA: Diagnosis not present

## 2022-08-03 DIAGNOSIS — F3181 Bipolar II disorder: Secondary | ICD-10-CM | POA: Diagnosis not present

## 2022-08-03 DIAGNOSIS — F900 Attention-deficit hyperactivity disorder, predominantly inattentive type: Secondary | ICD-10-CM | POA: Diagnosis not present

## 2022-08-03 DIAGNOSIS — F332 Major depressive disorder, recurrent severe without psychotic features: Secondary | ICD-10-CM | POA: Diagnosis not present

## 2022-08-09 DIAGNOSIS — S62305D Unspecified fracture of fourth metacarpal bone, left hand, subsequent encounter for fracture with routine healing: Secondary | ICD-10-CM | POA: Diagnosis not present

## 2022-08-16 DIAGNOSIS — F900 Attention-deficit hyperactivity disorder, predominantly inattentive type: Secondary | ICD-10-CM | POA: Diagnosis not present

## 2022-08-16 DIAGNOSIS — S62305D Unspecified fracture of fourth metacarpal bone, left hand, subsequent encounter for fracture with routine healing: Secondary | ICD-10-CM | POA: Diagnosis not present

## 2022-08-16 DIAGNOSIS — F3181 Bipolar II disorder: Secondary | ICD-10-CM | POA: Diagnosis not present

## 2022-08-16 DIAGNOSIS — F411 Generalized anxiety disorder: Secondary | ICD-10-CM | POA: Diagnosis not present

## 2022-08-23 DIAGNOSIS — F900 Attention-deficit hyperactivity disorder, predominantly inattentive type: Secondary | ICD-10-CM | POA: Diagnosis not present

## 2022-08-23 DIAGNOSIS — F411 Generalized anxiety disorder: Secondary | ICD-10-CM | POA: Diagnosis not present

## 2022-08-23 DIAGNOSIS — F3181 Bipolar II disorder: Secondary | ICD-10-CM | POA: Diagnosis not present

## 2022-08-30 DIAGNOSIS — F3181 Bipolar II disorder: Secondary | ICD-10-CM | POA: Diagnosis not present

## 2022-08-30 DIAGNOSIS — F411 Generalized anxiety disorder: Secondary | ICD-10-CM | POA: Diagnosis not present

## 2022-08-30 DIAGNOSIS — F900 Attention-deficit hyperactivity disorder, predominantly inattentive type: Secondary | ICD-10-CM | POA: Diagnosis not present

## 2022-08-31 DIAGNOSIS — F900 Attention-deficit hyperactivity disorder, predominantly inattentive type: Secondary | ICD-10-CM | POA: Diagnosis not present

## 2022-08-31 DIAGNOSIS — F332 Major depressive disorder, recurrent severe without psychotic features: Secondary | ICD-10-CM | POA: Diagnosis not present

## 2022-08-31 DIAGNOSIS — F3181 Bipolar II disorder: Secondary | ICD-10-CM | POA: Diagnosis not present

## 2022-09-06 DIAGNOSIS — S62305D Unspecified fracture of fourth metacarpal bone, left hand, subsequent encounter for fracture with routine healing: Secondary | ICD-10-CM | POA: Diagnosis not present

## 2022-09-13 DIAGNOSIS — F3181 Bipolar II disorder: Secondary | ICD-10-CM | POA: Diagnosis not present

## 2022-09-13 DIAGNOSIS — F411 Generalized anxiety disorder: Secondary | ICD-10-CM | POA: Diagnosis not present

## 2022-09-13 DIAGNOSIS — F900 Attention-deficit hyperactivity disorder, predominantly inattentive type: Secondary | ICD-10-CM | POA: Diagnosis not present

## 2022-09-20 DIAGNOSIS — F3181 Bipolar II disorder: Secondary | ICD-10-CM | POA: Diagnosis not present

## 2022-09-20 DIAGNOSIS — F411 Generalized anxiety disorder: Secondary | ICD-10-CM | POA: Diagnosis not present

## 2022-09-20 DIAGNOSIS — F900 Attention-deficit hyperactivity disorder, predominantly inattentive type: Secondary | ICD-10-CM | POA: Diagnosis not present

## 2022-09-28 DIAGNOSIS — F332 Major depressive disorder, recurrent severe without psychotic features: Secondary | ICD-10-CM | POA: Diagnosis not present

## 2022-09-28 DIAGNOSIS — F3181 Bipolar II disorder: Secondary | ICD-10-CM | POA: Diagnosis not present

## 2022-09-28 DIAGNOSIS — F900 Attention-deficit hyperactivity disorder, predominantly inattentive type: Secondary | ICD-10-CM | POA: Diagnosis not present

## 2022-10-04 DIAGNOSIS — F3181 Bipolar II disorder: Secondary | ICD-10-CM | POA: Diagnosis not present

## 2022-10-04 DIAGNOSIS — F411 Generalized anxiety disorder: Secondary | ICD-10-CM | POA: Diagnosis not present

## 2022-10-04 DIAGNOSIS — F900 Attention-deficit hyperactivity disorder, predominantly inattentive type: Secondary | ICD-10-CM | POA: Diagnosis not present

## 2022-10-11 DIAGNOSIS — F411 Generalized anxiety disorder: Secondary | ICD-10-CM | POA: Diagnosis not present

## 2022-10-11 DIAGNOSIS — F900 Attention-deficit hyperactivity disorder, predominantly inattentive type: Secondary | ICD-10-CM | POA: Diagnosis not present

## 2022-10-11 DIAGNOSIS — F3181 Bipolar II disorder: Secondary | ICD-10-CM | POA: Diagnosis not present

## 2022-10-25 DIAGNOSIS — F411 Generalized anxiety disorder: Secondary | ICD-10-CM | POA: Diagnosis not present

## 2022-10-25 DIAGNOSIS — F900 Attention-deficit hyperactivity disorder, predominantly inattentive type: Secondary | ICD-10-CM | POA: Diagnosis not present

## 2022-10-25 DIAGNOSIS — F3181 Bipolar II disorder: Secondary | ICD-10-CM | POA: Diagnosis not present

## 2022-10-26 DIAGNOSIS — F900 Attention-deficit hyperactivity disorder, predominantly inattentive type: Secondary | ICD-10-CM | POA: Diagnosis not present

## 2022-10-26 DIAGNOSIS — F3181 Bipolar II disorder: Secondary | ICD-10-CM | POA: Diagnosis not present

## 2022-10-26 DIAGNOSIS — F332 Major depressive disorder, recurrent severe without psychotic features: Secondary | ICD-10-CM | POA: Diagnosis not present

## 2022-11-05 DIAGNOSIS — F3181 Bipolar II disorder: Secondary | ICD-10-CM | POA: Diagnosis not present

## 2022-11-05 DIAGNOSIS — F900 Attention-deficit hyperactivity disorder, predominantly inattentive type: Secondary | ICD-10-CM | POA: Diagnosis not present

## 2022-11-05 DIAGNOSIS — F411 Generalized anxiety disorder: Secondary | ICD-10-CM | POA: Diagnosis not present

## 2022-11-10 DIAGNOSIS — F3181 Bipolar II disorder: Secondary | ICD-10-CM | POA: Diagnosis not present

## 2022-11-10 DIAGNOSIS — F900 Attention-deficit hyperactivity disorder, predominantly inattentive type: Secondary | ICD-10-CM | POA: Diagnosis not present

## 2022-11-10 DIAGNOSIS — F411 Generalized anxiety disorder: Secondary | ICD-10-CM | POA: Diagnosis not present

## 2022-11-17 DIAGNOSIS — F900 Attention-deficit hyperactivity disorder, predominantly inattentive type: Secondary | ICD-10-CM | POA: Diagnosis not present

## 2022-11-17 DIAGNOSIS — F3181 Bipolar II disorder: Secondary | ICD-10-CM | POA: Diagnosis not present

## 2022-11-17 DIAGNOSIS — F411 Generalized anxiety disorder: Secondary | ICD-10-CM | POA: Diagnosis not present

## 2022-11-22 DIAGNOSIS — F411 Generalized anxiety disorder: Secondary | ICD-10-CM | POA: Diagnosis not present

## 2022-11-22 DIAGNOSIS — F900 Attention-deficit hyperactivity disorder, predominantly inattentive type: Secondary | ICD-10-CM | POA: Diagnosis not present

## 2022-11-22 DIAGNOSIS — F3181 Bipolar II disorder: Secondary | ICD-10-CM | POA: Diagnosis not present

## 2022-11-23 DIAGNOSIS — F332 Major depressive disorder, recurrent severe without psychotic features: Secondary | ICD-10-CM | POA: Diagnosis not present

## 2022-11-23 DIAGNOSIS — F900 Attention-deficit hyperactivity disorder, predominantly inattentive type: Secondary | ICD-10-CM | POA: Diagnosis not present

## 2022-11-23 DIAGNOSIS — F3181 Bipolar II disorder: Secondary | ICD-10-CM | POA: Diagnosis not present

## 2022-12-01 DIAGNOSIS — F3181 Bipolar II disorder: Secondary | ICD-10-CM | POA: Diagnosis not present

## 2022-12-06 DIAGNOSIS — F3181 Bipolar II disorder: Secondary | ICD-10-CM | POA: Diagnosis not present

## 2022-12-14 DIAGNOSIS — F3181 Bipolar II disorder: Secondary | ICD-10-CM | POA: Diagnosis not present

## 2022-12-21 DIAGNOSIS — F332 Major depressive disorder, recurrent severe without psychotic features: Secondary | ICD-10-CM | POA: Diagnosis not present

## 2022-12-22 DIAGNOSIS — F3181 Bipolar II disorder: Secondary | ICD-10-CM | POA: Diagnosis not present

## 2022-12-28 DIAGNOSIS — F3181 Bipolar II disorder: Secondary | ICD-10-CM | POA: Diagnosis not present

## 2023-01-04 DIAGNOSIS — F3181 Bipolar II disorder: Secondary | ICD-10-CM | POA: Diagnosis not present

## 2023-01-13 DIAGNOSIS — F3181 Bipolar II disorder: Secondary | ICD-10-CM | POA: Diagnosis not present

## 2023-01-27 DIAGNOSIS — F3181 Bipolar II disorder: Secondary | ICD-10-CM | POA: Diagnosis not present

## 2023-02-01 DIAGNOSIS — F411 Generalized anxiety disorder: Secondary | ICD-10-CM | POA: Diagnosis not present

## 2023-02-01 DIAGNOSIS — F3181 Bipolar II disorder: Secondary | ICD-10-CM | POA: Diagnosis not present

## 2023-02-01 DIAGNOSIS — F900 Attention-deficit hyperactivity disorder, predominantly inattentive type: Secondary | ICD-10-CM | POA: Diagnosis not present

## 2023-02-08 DIAGNOSIS — F3181 Bipolar II disorder: Secondary | ICD-10-CM | POA: Diagnosis not present

## 2023-02-08 DIAGNOSIS — F900 Attention-deficit hyperactivity disorder, predominantly inattentive type: Secondary | ICD-10-CM | POA: Diagnosis not present

## 2023-02-08 DIAGNOSIS — F411 Generalized anxiety disorder: Secondary | ICD-10-CM | POA: Diagnosis not present

## 2023-02-15 DIAGNOSIS — F900 Attention-deficit hyperactivity disorder, predominantly inattentive type: Secondary | ICD-10-CM | POA: Diagnosis not present

## 2023-02-15 DIAGNOSIS — F411 Generalized anxiety disorder: Secondary | ICD-10-CM | POA: Diagnosis not present

## 2023-02-15 DIAGNOSIS — F3181 Bipolar II disorder: Secondary | ICD-10-CM | POA: Diagnosis not present

## 2023-02-17 DIAGNOSIS — F900 Attention-deficit hyperactivity disorder, predominantly inattentive type: Secondary | ICD-10-CM | POA: Diagnosis not present

## 2023-02-17 DIAGNOSIS — F3181 Bipolar II disorder: Secondary | ICD-10-CM | POA: Diagnosis not present

## 2023-02-22 DIAGNOSIS — F900 Attention-deficit hyperactivity disorder, predominantly inattentive type: Secondary | ICD-10-CM | POA: Diagnosis not present

## 2023-02-22 DIAGNOSIS — F411 Generalized anxiety disorder: Secondary | ICD-10-CM | POA: Diagnosis not present

## 2023-02-22 DIAGNOSIS — F3181 Bipolar II disorder: Secondary | ICD-10-CM | POA: Diagnosis not present

## 2023-03-01 DIAGNOSIS — F900 Attention-deficit hyperactivity disorder, predominantly inattentive type: Secondary | ICD-10-CM | POA: Diagnosis not present

## 2023-03-01 DIAGNOSIS — F3181 Bipolar II disorder: Secondary | ICD-10-CM | POA: Diagnosis not present

## 2023-03-01 DIAGNOSIS — F411 Generalized anxiety disorder: Secondary | ICD-10-CM | POA: Diagnosis not present

## 2023-03-03 DIAGNOSIS — F900 Attention-deficit hyperactivity disorder, predominantly inattentive type: Secondary | ICD-10-CM | POA: Diagnosis not present

## 2023-03-03 DIAGNOSIS — F3181 Bipolar II disorder: Secondary | ICD-10-CM | POA: Diagnosis not present

## 2023-03-08 DIAGNOSIS — F3181 Bipolar II disorder: Secondary | ICD-10-CM | POA: Diagnosis not present

## 2023-03-08 DIAGNOSIS — F411 Generalized anxiety disorder: Secondary | ICD-10-CM | POA: Diagnosis not present

## 2023-03-08 DIAGNOSIS — F900 Attention-deficit hyperactivity disorder, predominantly inattentive type: Secondary | ICD-10-CM | POA: Diagnosis not present

## 2023-03-29 DIAGNOSIS — F900 Attention-deficit hyperactivity disorder, predominantly inattentive type: Secondary | ICD-10-CM | POA: Diagnosis not present

## 2023-03-29 DIAGNOSIS — F3181 Bipolar II disorder: Secondary | ICD-10-CM | POA: Diagnosis not present

## 2023-03-29 DIAGNOSIS — F411 Generalized anxiety disorder: Secondary | ICD-10-CM | POA: Diagnosis not present

## 2023-04-05 DIAGNOSIS — F900 Attention-deficit hyperactivity disorder, predominantly inattentive type: Secondary | ICD-10-CM | POA: Diagnosis not present

## 2023-04-05 DIAGNOSIS — F3181 Bipolar II disorder: Secondary | ICD-10-CM | POA: Diagnosis not present

## 2023-04-05 DIAGNOSIS — F411 Generalized anxiety disorder: Secondary | ICD-10-CM | POA: Diagnosis not present

## 2023-04-12 DIAGNOSIS — F411 Generalized anxiety disorder: Secondary | ICD-10-CM | POA: Diagnosis not present

## 2023-04-12 DIAGNOSIS — F3181 Bipolar II disorder: Secondary | ICD-10-CM | POA: Diagnosis not present

## 2023-04-12 DIAGNOSIS — F900 Attention-deficit hyperactivity disorder, predominantly inattentive type: Secondary | ICD-10-CM | POA: Diagnosis not present

## 2023-04-14 DIAGNOSIS — F3181 Bipolar II disorder: Secondary | ICD-10-CM | POA: Diagnosis not present

## 2023-04-14 DIAGNOSIS — F900 Attention-deficit hyperactivity disorder, predominantly inattentive type: Secondary | ICD-10-CM | POA: Diagnosis not present

## 2023-04-22 DIAGNOSIS — F3181 Bipolar II disorder: Secondary | ICD-10-CM | POA: Diagnosis not present

## 2023-04-22 DIAGNOSIS — F900 Attention-deficit hyperactivity disorder, predominantly inattentive type: Secondary | ICD-10-CM | POA: Diagnosis not present

## 2023-04-22 DIAGNOSIS — F411 Generalized anxiety disorder: Secondary | ICD-10-CM | POA: Diagnosis not present

## 2023-04-28 DIAGNOSIS — F3181 Bipolar II disorder: Secondary | ICD-10-CM | POA: Diagnosis not present

## 2023-04-28 DIAGNOSIS — F900 Attention-deficit hyperactivity disorder, predominantly inattentive type: Secondary | ICD-10-CM | POA: Diagnosis not present

## 2023-04-29 DIAGNOSIS — F411 Generalized anxiety disorder: Secondary | ICD-10-CM | POA: Diagnosis not present

## 2023-04-29 DIAGNOSIS — F900 Attention-deficit hyperactivity disorder, predominantly inattentive type: Secondary | ICD-10-CM | POA: Diagnosis not present

## 2023-04-29 DIAGNOSIS — F3181 Bipolar II disorder: Secondary | ICD-10-CM | POA: Diagnosis not present

## 2023-05-03 DIAGNOSIS — F900 Attention-deficit hyperactivity disorder, predominantly inattentive type: Secondary | ICD-10-CM | POA: Diagnosis not present

## 2023-05-03 DIAGNOSIS — F411 Generalized anxiety disorder: Secondary | ICD-10-CM | POA: Diagnosis not present

## 2023-05-03 DIAGNOSIS — F3181 Bipolar II disorder: Secondary | ICD-10-CM | POA: Diagnosis not present

## 2023-05-12 DIAGNOSIS — F3181 Bipolar II disorder: Secondary | ICD-10-CM | POA: Diagnosis not present

## 2023-05-12 DIAGNOSIS — F900 Attention-deficit hyperactivity disorder, predominantly inattentive type: Secondary | ICD-10-CM | POA: Diagnosis not present

## 2023-05-17 DIAGNOSIS — F900 Attention-deficit hyperactivity disorder, predominantly inattentive type: Secondary | ICD-10-CM | POA: Diagnosis not present

## 2023-05-17 DIAGNOSIS — F411 Generalized anxiety disorder: Secondary | ICD-10-CM | POA: Diagnosis not present

## 2023-05-17 DIAGNOSIS — F3181 Bipolar II disorder: Secondary | ICD-10-CM | POA: Diagnosis not present

## 2023-05-24 DIAGNOSIS — F3181 Bipolar II disorder: Secondary | ICD-10-CM | POA: Diagnosis not present

## 2023-05-24 DIAGNOSIS — F411 Generalized anxiety disorder: Secondary | ICD-10-CM | POA: Diagnosis not present

## 2023-05-24 DIAGNOSIS — F900 Attention-deficit hyperactivity disorder, predominantly inattentive type: Secondary | ICD-10-CM | POA: Diagnosis not present

## 2023-05-31 DIAGNOSIS — F3181 Bipolar II disorder: Secondary | ICD-10-CM | POA: Diagnosis not present

## 2023-05-31 DIAGNOSIS — F411 Generalized anxiety disorder: Secondary | ICD-10-CM | POA: Diagnosis not present

## 2023-05-31 DIAGNOSIS — F900 Attention-deficit hyperactivity disorder, predominantly inattentive type: Secondary | ICD-10-CM | POA: Diagnosis not present

## 2023-06-01 DIAGNOSIS — F900 Attention-deficit hyperactivity disorder, predominantly inattentive type: Secondary | ICD-10-CM | POA: Diagnosis not present

## 2023-06-01 DIAGNOSIS — F3181 Bipolar II disorder: Secondary | ICD-10-CM | POA: Diagnosis not present

## 2023-06-14 DIAGNOSIS — F3181 Bipolar II disorder: Secondary | ICD-10-CM | POA: Diagnosis not present

## 2023-06-14 DIAGNOSIS — F900 Attention-deficit hyperactivity disorder, predominantly inattentive type: Secondary | ICD-10-CM | POA: Diagnosis not present

## 2023-06-14 DIAGNOSIS — F411 Generalized anxiety disorder: Secondary | ICD-10-CM | POA: Diagnosis not present

## 2023-06-21 DIAGNOSIS — F411 Generalized anxiety disorder: Secondary | ICD-10-CM | POA: Diagnosis not present

## 2023-06-21 DIAGNOSIS — F3181 Bipolar II disorder: Secondary | ICD-10-CM | POA: Diagnosis not present

## 2023-06-21 DIAGNOSIS — F900 Attention-deficit hyperactivity disorder, predominantly inattentive type: Secondary | ICD-10-CM | POA: Diagnosis not present

## 2023-06-28 DIAGNOSIS — F900 Attention-deficit hyperactivity disorder, predominantly inattentive type: Secondary | ICD-10-CM | POA: Diagnosis not present

## 2023-06-28 DIAGNOSIS — F411 Generalized anxiety disorder: Secondary | ICD-10-CM | POA: Diagnosis not present

## 2023-06-28 DIAGNOSIS — F3181 Bipolar II disorder: Secondary | ICD-10-CM | POA: Diagnosis not present

## 2023-07-04 DIAGNOSIS — F3181 Bipolar II disorder: Secondary | ICD-10-CM | POA: Diagnosis not present

## 2023-07-04 DIAGNOSIS — F900 Attention-deficit hyperactivity disorder, predominantly inattentive type: Secondary | ICD-10-CM | POA: Diagnosis not present

## 2023-07-04 DIAGNOSIS — F411 Generalized anxiety disorder: Secondary | ICD-10-CM | POA: Diagnosis not present

## 2023-07-05 DIAGNOSIS — F3181 Bipolar II disorder: Secondary | ICD-10-CM | POA: Diagnosis not present

## 2023-07-05 DIAGNOSIS — F900 Attention-deficit hyperactivity disorder, predominantly inattentive type: Secondary | ICD-10-CM | POA: Diagnosis not present

## 2023-07-05 DIAGNOSIS — F411 Generalized anxiety disorder: Secondary | ICD-10-CM | POA: Diagnosis not present

## 2023-07-11 ENCOUNTER — Telehealth: Admitting: Physician Assistant

## 2023-07-11 DIAGNOSIS — H00011 Hordeolum externum right upper eyelid: Secondary | ICD-10-CM | POA: Diagnosis not present

## 2023-07-11 MED ORDER — ERYTHROMYCIN 5 MG/GM OP OINT: 1.0000 | TOPICAL_OINTMENT | Freq: Two times a day (BID) | OPHTHALMIC | 0 refills | Status: AC

## 2023-07-11 NOTE — Patient Instructions (Signed)
 Zandra Hew, thank you for joining Angelia Kelp, PA-C for today's virtual visit.  While this provider is not your primary care provider (PCP), if your PCP is located in our provider database this encounter information will be shared with them immediately following your visit.   A [facility] MyChart account gives you access to today's visit and all your visits, tests, and labs performed at [facility]  click here if you don't have a [facility] MyChart account or go to [email or web address]  Consent: (Patient) Erin Ayala provided verbal consent for this virtual visit at the beginning of the encounter.  Current Medications:  Current Outpatient Medications:    erythromycin ophthalmic ointment, Place 1 Application into the right eye 2 (two) times daily for 5 days., Disp: 10 g, Rfl: 0   amphetamine -dextroamphetamine  (ADDERALL) 5 MG tablet, Take 1 tablet (5 mg total) by mouth daily., Disp: 30 tablet, Rfl: 0   buPROPion  (WELLBUTRIN  XL) 300 MG 24 hr tablet, Take 1 tablet (300 mg total) by mouth daily., Disp: 90 tablet, Rfl: 3   hydrOXYzine  (ATARAX ) 10 MG tablet, TAKE 1 TABLET BY MOUTH THREE TIMES A DAY AS NEEDED, Disp: 270 tablet, Rfl: 2   lamoTRIgine  (LAMICTAL ) 100 MG tablet, Take 1 tablet (100 mg total) by mouth daily., Disp: 90 tablet, Rfl: 3   Medications ordered in this encounter:  Meds ordered this encounter  Medications   erythromycin ophthalmic ointment    Sig: Place 1 Application into the right eye 2 (two) times daily for 5 days.    Dispense:  10 g    Refill:  0    Supervising Provider:   Corine Dice [[id number]]     *If you need refills on other medications prior to your next appointment, please contact your pharmacy*  Follow-Up: Call back or seek an in-person evaluation if the symptoms worsen or if the condition fails to improve as anticipated.  [facility] [phone]  Other Instructions  Stye A stye, also known as a  hordeolum, is a bump that forms on an eyelid. It may look like a pimple next to the eyelash. A stye can form inside the eyelid (internal stye) or outside the eyelid (external stye). A stye can cause redness, swelling, and pain on the eyelid. Styes are very common. Anyone can get them at any age. They usually occur in just one eye at a time, but you may have more than one in either eye. What are the causes? A stye is caused by an infection. The infection is almost always caused by bacteria called Staphylococcus aureus. This is a common type of bacteria that lives on the skin. An internal stye may result from an infected oil-producing gland inside the eyelid. An external stye may be caused by an infection at the base of the eyelash (hair follicle). What increases the risk? You are more likely to develop a stye if: You have had a stye before. You have any of these conditions: Red, itchy, inflamed eyelids (blepharitis). A skin condition such as seborrheic dermatitis or rosacea. High fat levels in your blood (lipids). Dry eyes. What are the signs or symptoms? The most common symptom of a stye is eyelid pain. Internal styes are more painful than external styes. Other symptoms may include: Painful swelling of your eyelid. A scratchy feeling in your eye. Tearing and redness of your eye. A pimple-like bump on the edge of the eyelid. Pus draining from the stye. How is this diagnosed?  Your health care provider may be able to diagnose a stye just by examining your eye. The health care provider may also check to make sure: You do not have a fever or other signs of a more serious infection. The infection has not spread to other parts of your eye or areas around your eye. How is this treated? Most styes will clear up in a few days without treatment or with warm compresses applied to the area. You may need to use antibiotic drops or ointment to treat an infection. Sometimes, steroid drops or ointment are  used in addition to antibiotics. In some cases, your health care provider may give you a small steroid injection in the eyelid. If your stye does not heal with routine treatment, your health care provider may drain pus from the stye using a thin blade or needle. This may be done if the stye is large, causing a lot of pain, or affecting your vision. Follow these instructions at home: Take over-the-counter and prescription medicines only as told by your health care provider. This includes eye drops or ointments. If you were prescribed an antibiotic medicine, steroid medicine, or both, apply or use them as told by your health care provider. Do not stop using the medicine even if your condition improves. Apply a warm, wet cloth (warm compress) to your eye for 5-10 minutes, 4 to 6 times a day. Clean the affected eyelid as directed by your health care provider. Do not wear contact lenses or eye makeup until your stye has healed and your health care provider says that it is safe. Do not try to pop or drain the stye. Do not rub your eye. Contact a health care provider if: You have chills or a fever. Your stye does not go away after several days. Your stye affects your vision. Your eyeball becomes swollen, red, or painful. Get help right away if: You have pain when moving your eye around. Summary A stye is a bump that forms on an eyelid. It may look like a pimple next to the eyelash. A stye can form inside the eyelid (internal stye) or outside the eyelid (external stye). A stye can cause redness, swelling, and pain on the eyelid. Your health care provider may be able to diagnose a stye just by examining your eye. Apply a warm, wet cloth (warm compress) to your eye for 5-10 minutes, 4 to 6 times a day. This information is not intended to replace advice given to you by your health care provider. Make sure you discuss any questions you have with your health care provider. Document Revised: 03/19/2020  Document Reviewed: 03/19/2020 Elsevier Patient Education  2024 Elsevier Inc.   If you have been instructed to have an in-person evaluation today at a local Urgent Care facility, please use the link below. It will take you to a list of all of our available [facility], including address, phone number and hours of operation. Please do not delay care.  [facility]  If you or a family member do not have a primary care provider, use the link below to schedule a visit and establish care. When you choose a [facility] primary care physician or advanced practice provider, you gain a long-term partner in health. Find a Primary Care Provider  Learn more about [facility]'s in-office and virtual care options:  - Get Care Now

## 2023-07-11 NOTE — Progress Notes (Signed)
 Virtual Visit Consent   Erin Ayala, you are scheduled for a virtual visit with a [facility] provider today. Just as with appointments in the office, your consent must be obtained to participate. Your consent will be active for this visit and any virtual visit you may have with one of our providers in the next 365 days. If you have a MyChart account, a copy of this consent can be sent to you electronically.  As this is a virtual visit, video technology does not allow for your provider to perform a traditional examination. This may limit your provider's ability to fully assess your condition. If your provider identifies any concerns that need to be evaluated in person or the need to arrange testing (such as labs, EKG, etc.), we will make arrangements to do so. Although advances in technology are sophisticated, we cannot ensure that it will always work on either your end or our end. If the connection with a video visit is poor, the visit may have to be switched to a telephone visit. With either a video or telephone visit, we are not always able to ensure that we have a secure connection.  By engaging in this virtual visit, you consent to the provision of healthcare and authorize for your insurance to be billed (if applicable) for the services provided during this visit. Depending on your insurance coverage, you may receive a charge related to this service.  I need to obtain your verbal consent now. Are you willing to proceed with your visit today? Erin Ayala has provided verbal consent on 07/11/2023 for a virtual visit (video or telephone). Angelia Kelp, PA-C  Date: 07/11/2023 10:06 AM   Virtual Visit via Video Note   I, Angelia Kelp, connected with  Erin Ayala  ([id number], 04-Feb-1985) on 07/11/23 at 10:00 AM EDT by a video-enabled telemedicine application and verified that I am speaking with the correct person using two identifiers.  Location: Patient: Virtual Visit  Location Patient: Home Provider: Virtual Visit Location Provider: Home Office   I discussed the limitations of evaluation and management by telemedicine and the availability of in person appointments. The patient expressed understanding and agreed to proceed.    History of Present Illness: Erin Ayala is a 38 y.o. who identifies as a female who was assigned female at birth, and is being seen today for stye of the right upper eyelid.  HPI: Eye Problem  The right eye is affected. This is a new problem. The current episode started 1 to 4 weeks ago (Started Monday, 07/14/23, worsened Wednesday). The problem occurs constantly. The problem has been gradually worsening. There was no injury mechanism. The pain is mild. There is No known exposure to pink eye. She Does not wear contacts. Associated symptoms include an eye discharge, eye redness (right upper eyelid) and a foreign body sensation. Pertinent negatives include no blurred vision, double vision, fever, nausea, photophobia or recent URI. Treatments tried: warm compresses. The treatment provided no relief.     Problems:  Patient Active Problem List   Diagnosis Date Noted   Bipolar I disorder, most recent episode depressed (HCC) 02/14/2020   Vacuum extractor delivery, delivered 04/22/2012   Asthma 10/12/2011   ADD (attention deficit disorder) 10/12/2011   Depression 10/12/2011   Irregular periods/menstrual cycles 10/12/2011    Allergies: No Known Allergies Medications:  Current Outpatient Medications:    erythromycin ophthalmic ointment, Place 1 Application into the right eye 2 (two) times daily for 5 days.,  Disp: 10 g, Rfl: 0   amphetamine -dextroamphetamine  (ADDERALL) 5 MG tablet, Take 1 tablet (5 mg total) by mouth daily., Disp: 30 tablet, Rfl: 0   buPROPion  (WELLBUTRIN  XL) 300 MG 24 hr tablet, Take 1 tablet (300 mg total) by mouth daily., Disp: 90 tablet, Rfl: 3   hydrOXYzine  (ATARAX ) 10 MG tablet, TAKE 1 TABLET BY MOUTH THREE TIMES  A DAY AS NEEDED, Disp: 270 tablet, Rfl: 2   lamoTRIgine  (LAMICTAL ) 100 MG tablet, Take 1 tablet (100 mg total) by mouth daily., Disp: 90 tablet, Rfl: 3  Observations/Objective: Patient is well-developed, well-nourished in no acute distress.  Resting comfortably at home.  Head is normocephalic, atraumatic.  No labored breathing.  Speech is clear and coherent with logical content.  Patient is alert and oriented at baseline.  Large external stye noted on the right upper lid just lateral to midline  Assessment and Plan: 1. Hordeolum externum of right upper eyelid (Primary) - erythromycin ophthalmic ointment; Place 1 Application into the right eye 2 (two) times daily for 5 days.  Dispense: 10 g; Refill: 0  - Stye present - Erythromycin ointment prescribed - Warm compresses - Good hand hygiene - Seek in person evaluation if symptoms worsen or fail to improve   Follow Up Instructions: I discussed the assessment and treatment plan with the patient. The patient was provided an opportunity to ask questions and all were answered. The patient agreed with the plan and demonstrated an understanding of the instructions.  A copy of instructions were sent to the patient via MyChart unless otherwise noted below.    The patient was advised to call back or seek an in-person evaluation if the symptoms worsen or if the condition fails to improve as anticipated.    Angelia Kelp, PA-C

## 2023-07-21 DIAGNOSIS — F900 Attention-deficit hyperactivity disorder, predominantly inattentive type: Secondary | ICD-10-CM | POA: Diagnosis not present

## 2023-07-21 DIAGNOSIS — F411 Generalized anxiety disorder: Secondary | ICD-10-CM | POA: Diagnosis not present

## 2023-07-21 DIAGNOSIS — F3181 Bipolar II disorder: Secondary | ICD-10-CM | POA: Diagnosis not present

## 2023-07-26 DIAGNOSIS — F411 Generalized anxiety disorder: Secondary | ICD-10-CM | POA: Diagnosis not present

## 2023-07-26 DIAGNOSIS — F900 Attention-deficit hyperactivity disorder, predominantly inattentive type: Secondary | ICD-10-CM | POA: Diagnosis not present

## 2023-07-26 DIAGNOSIS — F3181 Bipolar II disorder: Secondary | ICD-10-CM | POA: Diagnosis not present

## 2023-08-02 DIAGNOSIS — F3181 Bipolar II disorder: Secondary | ICD-10-CM | POA: Diagnosis not present

## 2023-08-02 DIAGNOSIS — F900 Attention-deficit hyperactivity disorder, predominantly inattentive type: Secondary | ICD-10-CM | POA: Diagnosis not present

## 2023-08-02 DIAGNOSIS — F411 Generalized anxiety disorder: Secondary | ICD-10-CM | POA: Diagnosis not present

## 2023-08-03 DIAGNOSIS — F3181 Bipolar II disorder: Secondary | ICD-10-CM | POA: Diagnosis not present

## 2023-08-03 DIAGNOSIS — F411 Generalized anxiety disorder: Secondary | ICD-10-CM | POA: Diagnosis not present

## 2023-08-03 DIAGNOSIS — F900 Attention-deficit hyperactivity disorder, predominantly inattentive type: Secondary | ICD-10-CM | POA: Diagnosis not present

## 2023-08-09 DIAGNOSIS — F411 Generalized anxiety disorder: Secondary | ICD-10-CM | POA: Diagnosis not present

## 2023-08-09 DIAGNOSIS — F3181 Bipolar II disorder: Secondary | ICD-10-CM | POA: Diagnosis not present

## 2023-08-09 DIAGNOSIS — F900 Attention-deficit hyperactivity disorder, predominantly inattentive type: Secondary | ICD-10-CM | POA: Diagnosis not present

## 2023-08-16 DIAGNOSIS — F411 Generalized anxiety disorder: Secondary | ICD-10-CM | POA: Diagnosis not present

## 2023-08-16 DIAGNOSIS — F900 Attention-deficit hyperactivity disorder, predominantly inattentive type: Secondary | ICD-10-CM | POA: Diagnosis not present

## 2023-08-16 DIAGNOSIS — F3181 Bipolar II disorder: Secondary | ICD-10-CM | POA: Diagnosis not present

## 2023-08-23 DIAGNOSIS — F900 Attention-deficit hyperactivity disorder, predominantly inattentive type: Secondary | ICD-10-CM | POA: Diagnosis not present

## 2023-08-23 DIAGNOSIS — F411 Generalized anxiety disorder: Secondary | ICD-10-CM | POA: Diagnosis not present

## 2023-08-23 DIAGNOSIS — F3181 Bipolar II disorder: Secondary | ICD-10-CM | POA: Diagnosis not present

## 2023-09-13 DIAGNOSIS — F900 Attention-deficit hyperactivity disorder, predominantly inattentive type: Secondary | ICD-10-CM | POA: Diagnosis not present

## 2023-09-13 DIAGNOSIS — F411 Generalized anxiety disorder: Secondary | ICD-10-CM | POA: Diagnosis not present

## 2023-09-13 DIAGNOSIS — F3181 Bipolar II disorder: Secondary | ICD-10-CM | POA: Diagnosis not present

## 2023-09-20 DIAGNOSIS — F900 Attention-deficit hyperactivity disorder, predominantly inattentive type: Secondary | ICD-10-CM | POA: Diagnosis not present

## 2023-09-20 DIAGNOSIS — F411 Generalized anxiety disorder: Secondary | ICD-10-CM | POA: Diagnosis not present

## 2023-09-20 DIAGNOSIS — F3181 Bipolar II disorder: Secondary | ICD-10-CM | POA: Diagnosis not present

## 2023-09-27 DIAGNOSIS — F3181 Bipolar II disorder: Secondary | ICD-10-CM | POA: Diagnosis not present

## 2023-09-27 DIAGNOSIS — F411 Generalized anxiety disorder: Secondary | ICD-10-CM | POA: Diagnosis not present

## 2023-09-27 DIAGNOSIS — F900 Attention-deficit hyperactivity disorder, predominantly inattentive type: Secondary | ICD-10-CM | POA: Diagnosis not present

## 2023-10-04 DIAGNOSIS — F3181 Bipolar II disorder: Secondary | ICD-10-CM | POA: Diagnosis not present

## 2023-10-04 DIAGNOSIS — F900 Attention-deficit hyperactivity disorder, predominantly inattentive type: Secondary | ICD-10-CM | POA: Diagnosis not present

## 2023-10-04 DIAGNOSIS — F411 Generalized anxiety disorder: Secondary | ICD-10-CM | POA: Diagnosis not present

## 2023-10-11 DIAGNOSIS — F3181 Bipolar II disorder: Secondary | ICD-10-CM | POA: Diagnosis not present

## 2023-10-11 DIAGNOSIS — F900 Attention-deficit hyperactivity disorder, predominantly inattentive type: Secondary | ICD-10-CM | POA: Diagnosis not present

## 2023-10-11 DIAGNOSIS — F411 Generalized anxiety disorder: Secondary | ICD-10-CM | POA: Diagnosis not present

## 2023-10-18 DIAGNOSIS — F3181 Bipolar II disorder: Secondary | ICD-10-CM | POA: Diagnosis not present

## 2023-10-18 DIAGNOSIS — F411 Generalized anxiety disorder: Secondary | ICD-10-CM | POA: Diagnosis not present

## 2023-10-18 DIAGNOSIS — F900 Attention-deficit hyperactivity disorder, predominantly inattentive type: Secondary | ICD-10-CM | POA: Diagnosis not present

## 2023-10-25 DIAGNOSIS — F3181 Bipolar II disorder: Secondary | ICD-10-CM | POA: Diagnosis not present

## 2023-10-25 DIAGNOSIS — F411 Generalized anxiety disorder: Secondary | ICD-10-CM | POA: Diagnosis not present

## 2023-10-25 DIAGNOSIS — F900 Attention-deficit hyperactivity disorder, predominantly inattentive type: Secondary | ICD-10-CM | POA: Diagnosis not present

## 2023-11-01 DIAGNOSIS — F900 Attention-deficit hyperactivity disorder, predominantly inattentive type: Secondary | ICD-10-CM | POA: Diagnosis not present

## 2023-11-01 DIAGNOSIS — F3181 Bipolar II disorder: Secondary | ICD-10-CM | POA: Diagnosis not present

## 2023-11-01 DIAGNOSIS — F411 Generalized anxiety disorder: Secondary | ICD-10-CM | POA: Diagnosis not present

## 2023-11-08 DIAGNOSIS — F3181 Bipolar II disorder: Secondary | ICD-10-CM | POA: Diagnosis not present

## 2023-11-08 DIAGNOSIS — F900 Attention-deficit hyperactivity disorder, predominantly inattentive type: Secondary | ICD-10-CM | POA: Diagnosis not present

## 2023-11-08 DIAGNOSIS — F411 Generalized anxiety disorder: Secondary | ICD-10-CM | POA: Diagnosis not present

## 2023-11-15 DIAGNOSIS — F332 Major depressive disorder, recurrent severe without psychotic features: Secondary | ICD-10-CM | POA: Diagnosis not present

## 2023-11-15 DIAGNOSIS — F900 Attention-deficit hyperactivity disorder, predominantly inattentive type: Secondary | ICD-10-CM | POA: Diagnosis not present

## 2023-11-15 DIAGNOSIS — F3181 Bipolar II disorder: Secondary | ICD-10-CM | POA: Diagnosis not present

## 2023-11-15 DIAGNOSIS — F411 Generalized anxiety disorder: Secondary | ICD-10-CM | POA: Diagnosis not present

## 2023-11-22 DIAGNOSIS — F332 Major depressive disorder, recurrent severe without psychotic features: Secondary | ICD-10-CM | POA: Diagnosis not present

## 2023-11-22 DIAGNOSIS — F900 Attention-deficit hyperactivity disorder, predominantly inattentive type: Secondary | ICD-10-CM | POA: Diagnosis not present

## 2023-11-22 DIAGNOSIS — F411 Generalized anxiety disorder: Secondary | ICD-10-CM | POA: Diagnosis not present

## 2024-04-06 ENCOUNTER — Ambulatory Visit: Admitting: Nurse Practitioner
